# Patient Record
Sex: Female | Born: 1950
Health system: Southern US, Community
[De-identification: ages and names within clinical notes are randomized; demographics above are authoritative.]

## PROBLEM LIST (undated history)

## (undated) DIAGNOSIS — R011 Cardiac murmur, unspecified: Secondary | ICD-10-CM

## (undated) DIAGNOSIS — I10 Essential (primary) hypertension: Secondary | ICD-10-CM

## (undated) DIAGNOSIS — E039 Hypothyroidism, unspecified: Secondary | ICD-10-CM

## (undated) DIAGNOSIS — J019 Acute sinusitis, unspecified: Secondary | ICD-10-CM

## (undated) DIAGNOSIS — H918X9 Other specified hearing loss, unspecified ear: Secondary | ICD-10-CM

## (undated) DIAGNOSIS — M899 Disorder of bone, unspecified: Secondary | ICD-10-CM

## (undated) DIAGNOSIS — M949 Disorder of cartilage, unspecified: Secondary | ICD-10-CM

## (undated) DIAGNOSIS — D509 Iron deficiency anemia, unspecified: Secondary | ICD-10-CM

## (undated) DIAGNOSIS — E785 Hyperlipidemia, unspecified: Secondary | ICD-10-CM

## (undated) DIAGNOSIS — IMO0002 Reserved for concepts with insufficient information to code with codable children: Secondary | ICD-10-CM

## (undated) HISTORY — PX: TUBAL LIGATION: SHX77

## (undated) HISTORY — PX: BACK SURGERY: SHX140

## (undated) HISTORY — DX: Iron deficiency anemia, unspecified: D50.9

## (undated) HISTORY — DX: Hyperlipidemia, unspecified: E78.5

## (undated) HISTORY — DX: Reserved for concepts with insufficient information to code with codable children: IMO0002

## (undated) HISTORY — DX: Disorder of bone, unspecified: M89.9

## (undated) HISTORY — DX: Essential (primary) hypertension: I10

## (undated) HISTORY — DX: Other specified hearing loss, unspecified ear: H91.8X9

## (undated) HISTORY — DX: Cardiac murmur, unspecified: R01.1

## (undated) HISTORY — DX: Hypothyroidism, unspecified: E03.9

## (undated) HISTORY — PX: OTHER SURGICAL HISTORY: SHX169

## (undated) HISTORY — DX: Disorder of cartilage, unspecified: M94.9

## (undated) HISTORY — DX: Acute sinusitis, unspecified: J01.90

---

## 1972-10-24 DIAGNOSIS — I809 Phlebitis and thrombophlebitis of unspecified site: Secondary | ICD-10-CM

## 1972-10-24 HISTORY — DX: Phlebitis and thrombophlebitis of unspecified site: I80.9

## 2000-10-24 HISTORY — PX: OTHER SURGICAL HISTORY: SHX169

## 2003-04-01 ENCOUNTER — Encounter: Payer: Self-pay | Admitting: Family Medicine

## 2003-04-01 ENCOUNTER — Encounter: Admission: RE | Admit: 2003-04-01 | Discharge: 2003-04-01 | Payer: Self-pay | Admitting: Family Medicine

## 2003-04-24 ENCOUNTER — Other Ambulatory Visit: Admission: RE | Admit: 2003-04-24 | Discharge: 2003-04-24 | Payer: Self-pay | Admitting: Gynecology

## 2004-07-02 ENCOUNTER — Other Ambulatory Visit: Admission: RE | Admit: 2004-07-02 | Discharge: 2004-07-02 | Payer: Self-pay | Admitting: Gynecology

## 2005-03-10 ENCOUNTER — Ambulatory Visit: Payer: Self-pay | Admitting: Internal Medicine

## 2005-06-17 ENCOUNTER — Ambulatory Visit: Payer: Self-pay | Admitting: Endocrinology

## 2005-08-09 ENCOUNTER — Other Ambulatory Visit: Admission: RE | Admit: 2005-08-09 | Discharge: 2005-08-09 | Payer: Self-pay | Admitting: Gynecology

## 2005-08-16 ENCOUNTER — Ambulatory Visit: Payer: Self-pay | Admitting: Internal Medicine

## 2005-09-19 ENCOUNTER — Ambulatory Visit: Payer: Self-pay | Admitting: Internal Medicine

## 2005-09-26 ENCOUNTER — Ambulatory Visit: Payer: Self-pay | Admitting: Internal Medicine

## 2005-09-28 ENCOUNTER — Encounter: Admission: RE | Admit: 2005-09-28 | Discharge: 2005-09-28 | Payer: Self-pay | Admitting: Internal Medicine

## 2006-02-16 ENCOUNTER — Ambulatory Visit: Payer: Self-pay | Admitting: Internal Medicine

## 2006-08-14 ENCOUNTER — Other Ambulatory Visit: Admission: RE | Admit: 2006-08-14 | Discharge: 2006-08-14 | Payer: Self-pay | Admitting: Gynecology

## 2007-04-02 ENCOUNTER — Ambulatory Visit: Payer: Self-pay | Admitting: Internal Medicine

## 2007-08-17 ENCOUNTER — Other Ambulatory Visit: Admission: RE | Admit: 2007-08-17 | Discharge: 2007-08-17 | Payer: Self-pay | Admitting: Gynecology

## 2008-04-14 ENCOUNTER — Telehealth (INDEPENDENT_AMBULATORY_CARE_PROVIDER_SITE_OTHER): Payer: Self-pay | Admitting: *Deleted

## 2008-04-15 ENCOUNTER — Ambulatory Visit: Payer: Self-pay | Admitting: Internal Medicine

## 2008-04-15 DIAGNOSIS — M949 Disorder of cartilage, unspecified: Secondary | ICD-10-CM

## 2008-04-15 DIAGNOSIS — M899 Disorder of bone, unspecified: Secondary | ICD-10-CM | POA: Insufficient documentation

## 2008-04-15 DIAGNOSIS — E785 Hyperlipidemia, unspecified: Secondary | ICD-10-CM

## 2008-04-15 DIAGNOSIS — E039 Hypothyroidism, unspecified: Secondary | ICD-10-CM

## 2008-04-15 DIAGNOSIS — I10 Essential (primary) hypertension: Secondary | ICD-10-CM | POA: Insufficient documentation

## 2008-04-15 DIAGNOSIS — D509 Iron deficiency anemia, unspecified: Secondary | ICD-10-CM | POA: Insufficient documentation

## 2008-04-15 DIAGNOSIS — F411 Generalized anxiety disorder: Secondary | ICD-10-CM | POA: Insufficient documentation

## 2008-04-15 HISTORY — DX: Hypothyroidism, unspecified: E03.9

## 2008-04-15 HISTORY — DX: Disorder of bone, unspecified: M89.9

## 2008-04-15 HISTORY — DX: Hyperlipidemia, unspecified: E78.5

## 2008-04-15 HISTORY — DX: Essential (primary) hypertension: I10

## 2008-04-15 HISTORY — DX: Iron deficiency anemia, unspecified: D50.9

## 2008-08-07 ENCOUNTER — Telehealth (INDEPENDENT_AMBULATORY_CARE_PROVIDER_SITE_OTHER): Payer: Self-pay | Admitting: *Deleted

## 2008-11-21 ENCOUNTER — Telehealth: Payer: Self-pay | Admitting: Internal Medicine

## 2009-02-26 ENCOUNTER — Encounter: Payer: Self-pay | Admitting: Internal Medicine

## 2009-02-26 ENCOUNTER — Ambulatory Visit: Payer: Self-pay | Admitting: Gynecology

## 2009-03-02 ENCOUNTER — Ambulatory Visit: Payer: Self-pay | Admitting: Gynecology

## 2009-03-02 ENCOUNTER — Encounter: Payer: Self-pay | Admitting: Gynecology

## 2009-03-02 ENCOUNTER — Other Ambulatory Visit: Admission: RE | Admit: 2009-03-02 | Discharge: 2009-03-02 | Payer: Self-pay | Admitting: Gynecology

## 2009-03-11 ENCOUNTER — Ambulatory Visit: Payer: Self-pay | Admitting: Internal Medicine

## 2009-03-11 DIAGNOSIS — N259 Disorder resulting from impaired renal tubular function, unspecified: Secondary | ICD-10-CM | POA: Insufficient documentation

## 2009-03-11 LAB — CONVERTED CEMR LAB
Bilirubin Urine: NEGATIVE
Ketones, ur: NEGATIVE mg/dL
Total Protein, Urine: NEGATIVE mg/dL
Urine Glucose: NEGATIVE mg/dL
pH: 5.5 (ref 5.0–8.0)

## 2009-03-18 ENCOUNTER — Encounter: Admission: RE | Admit: 2009-03-18 | Discharge: 2009-03-18 | Payer: Self-pay | Admitting: Internal Medicine

## 2009-04-13 ENCOUNTER — Ambulatory Visit: Payer: Self-pay | Admitting: Internal Medicine

## 2009-04-13 LAB — CONVERTED CEMR LAB
Albumin: 4 g/dL (ref 3.5–5.2)
Alkaline Phosphatase: 57 units/L (ref 39–117)
BUN: 20 mg/dL (ref 6–23)
Basophils Absolute: 0 10*3/uL (ref 0.0–0.1)
Calcium: 9.5 mg/dL (ref 8.4–10.5)
Cholesterol: 299 mg/dL — ABNORMAL HIGH (ref 0–200)
Eosinophils Absolute: 0.1 10*3/uL (ref 0.0–0.7)
GFR calc non Af Amer: 54.53 mL/min (ref >60)
HDL: 44.2 mg/dL (ref >39.00)
Hemoglobin, Urine: NEGATIVE
Hemoglobin: 14.3 g/dL (ref 12.0–15.0)
Lymphocytes Relative: 27.6 % (ref 12.0–46.0)
MCHC: 34.9 g/dL (ref 30.0–36.0)
MCV: 94.5 fL (ref 78.0–100.0)
Monocytes Absolute: 0.7 10*3/uL (ref 0.1–1.0)
Neutro Abs: 4.3 10*3/uL (ref 1.4–7.7)
Nitrite: NEGATIVE
Potassium: 4.6 meq/L (ref 3.5–5.1)
RDW: 14 % (ref 11.5–14.6)
Sodium: 142 meq/L (ref 135–145)
Specific Gravity, Urine: 1.025 (ref 1.000–1.030)
TSH: 6.3 microintl units/mL — ABNORMAL HIGH (ref 0.35–5.50)
Total Protein: 7.4 g/dL (ref 6.0–8.3)
Urine Glucose: NEGATIVE mg/dL
Urobilinogen, UA: 0.2 (ref 0.0–1.0)
VLDL: 18 mg/dL (ref 0.0–40.0)

## 2009-04-15 ENCOUNTER — Ambulatory Visit: Payer: Self-pay | Admitting: Internal Medicine

## 2009-11-24 ENCOUNTER — Ambulatory Visit: Payer: Self-pay | Admitting: Internal Medicine

## 2009-11-24 DIAGNOSIS — IMO0002 Reserved for concepts with insufficient information to code with codable children: Secondary | ICD-10-CM

## 2009-11-24 HISTORY — DX: Reserved for concepts with insufficient information to code with codable children: IMO0002

## 2009-11-25 ENCOUNTER — Ambulatory Visit (HOSPITAL_COMMUNITY): Admission: RE | Admit: 2009-11-25 | Discharge: 2009-11-25 | Payer: Self-pay | Admitting: Internal Medicine

## 2009-12-01 ENCOUNTER — Encounter: Payer: Self-pay | Admitting: Internal Medicine

## 2009-12-18 ENCOUNTER — Encounter: Payer: Self-pay | Admitting: Internal Medicine

## 2009-12-23 ENCOUNTER — Ambulatory Visit (HOSPITAL_COMMUNITY): Admission: RE | Admit: 2009-12-23 | Discharge: 2009-12-24 | Payer: Self-pay | Admitting: Neurosurgery

## 2010-01-15 ENCOUNTER — Encounter: Payer: Self-pay | Admitting: Internal Medicine

## 2010-03-19 ENCOUNTER — Encounter: Payer: Self-pay | Admitting: Internal Medicine

## 2010-04-21 ENCOUNTER — Ambulatory Visit: Payer: Self-pay | Admitting: Internal Medicine

## 2010-04-21 ENCOUNTER — Telehealth: Payer: Self-pay | Admitting: Internal Medicine

## 2010-04-22 LAB — CONVERTED CEMR LAB
AST: 16 units/L (ref 0–37)
Albumin: 4.6 g/dL (ref 3.5–5.2)
Alkaline Phosphatase: 59 units/L (ref 39–117)
BUN: 17 mg/dL (ref 6–23)
Basophils Absolute: 0.1 10*3/uL (ref 0.0–0.1)
Bilirubin Urine: NEGATIVE
CO2: 29 meq/L (ref 19–32)
Calcium: 9.8 mg/dL (ref 8.4–10.5)
Cholesterol: 366 mg/dL — ABNORMAL HIGH (ref 0–200)
Creatinine, Ser: 0.9 mg/dL (ref 0.4–1.2)
Direct LDL: 287.7 mg/dL
Eosinophils Absolute: 0.1 10*3/uL (ref 0.0–0.7)
GFR calc non Af Amer: 71.22 mL/min (ref >60)
Glucose, Bld: 85 mg/dL (ref 70–99)
HDL: 53.2 mg/dL (ref >39.00)
Hemoglobin, Urine: NEGATIVE
Hemoglobin: 14.5 g/dL (ref 12.0–15.0)
Lymphocytes Relative: 29.3 % (ref 12.0–46.0)
Lymphs Abs: 2.2 10*3/uL (ref 0.7–4.0)
MCHC: 34.5 g/dL (ref 30.0–36.0)
Monocytes Relative: 9.8 % (ref 3.0–12.0)
Neutro Abs: 4.5 10*3/uL (ref 1.4–7.7)
Platelets: 198 10*3/uL (ref 150.0–400.0)
RDW: 13 % (ref 11.5–14.6)
Total Bilirubin: 0.4 mg/dL (ref 0.3–1.2)
Total CHOL/HDL Ratio: 7
Total Protein, Urine: NEGATIVE mg/dL
Urine Glucose: NEGATIVE mg/dL
Urobilinogen, UA: 0.2 (ref 0.0–1.0)
VLDL: 29 mg/dL (ref 0.0–40.0)
Vit D, 25-Hydroxy: 16 ng/mL — ABNORMAL LOW (ref 30–89)

## 2010-06-22 ENCOUNTER — Encounter: Payer: Self-pay | Admitting: Internal Medicine

## 2010-08-24 ENCOUNTER — Encounter (INDEPENDENT_AMBULATORY_CARE_PROVIDER_SITE_OTHER): Payer: Self-pay | Admitting: *Deleted

## 2010-09-30 ENCOUNTER — Ambulatory Visit: Payer: Self-pay | Admitting: Internal Medicine

## 2010-09-30 DIAGNOSIS — J019 Acute sinusitis, unspecified: Secondary | ICD-10-CM

## 2010-09-30 DIAGNOSIS — H918X9 Other specified hearing loss, unspecified ear: Secondary | ICD-10-CM | POA: Insufficient documentation

## 2010-09-30 HISTORY — DX: Other specified hearing loss, unspecified ear: H91.8X9

## 2010-09-30 HISTORY — DX: Acute sinusitis, unspecified: J01.90

## 2010-10-28 ENCOUNTER — Ambulatory Visit: Admit: 2010-10-28 | Payer: Self-pay | Admitting: Internal Medicine

## 2010-11-23 NOTE — Progress Notes (Signed)
       New/Updated Medications: WELCHOL 3.75 GM PACK (COLESEVELAM HCL) 1 pkt in 8 oz water by mouth once daily Prescriptions: WELCHOL 3.75 GM PACK (COLESEVELAM HCL) 1 pkt in 8 oz water by mouth once daily  #90 x 3   Entered and Authorized by:   Corwin Levins MD   Signed by:   Corwin Levins MD on 04/21/2010   Method used:   Electronically to        [id number] (retail)       [address]       [place], [place]  [address]       Ph: [phone] or [phone]       Fax: [phone]   RxID:   [id number]

## 2010-11-23 NOTE — Assessment & Plan Note (Signed)
Summary: REFILL--PER PT APPT REQUIRED--STC   Vital Signs:  Patient profile:   60 year old female Height:      66 inches Weight:      119.50 pounds BMI:     19.36 O2 Sat:      98 % on Room air Temp:     98.1 degrees F oral Pulse rate:   70 / minute BP sitting:   120 / 80  (left arm) Cuff size:   regular  Vitals Entered By: Zella Ball Ewing CMA Duncan Dull) (April 21, 2010 10:21 AM)  O2 Flow:  Room air  CC: Refills/RE   CC:  Refills/RE.  History of Present Illness: overall doing ok, Pt denies CP, sob, doe, wheezing, orthopnea, pnd, worsening LE edema, palps, dizziness or syncope  Pt denies new neuro symptoms such as headache, facial or extremity weakness  No new complaints  Problems Prior to Update: 1)  Lumbar Radiculopathy, Left  (ICD-724.4) 2)  Preventive Health Care  (ICD-V70.0) 3)  Renal Insufficiency  (ICD-588.9) 4)  Anemia-iron Deficiency  (ICD-280.9) 5)  Hyperlipidemia  (ICD-272.4) 6)  Osteopenia  (ICD-733.90) 7)  Hypothyroidism  (ICD-244.9) 8)  Hypertension  (ICD-401.9) 9)  Anxiety  (ICD-300.00) 10)  Preventive Health Care  (ICD-V70.0)  Medications Prior to Update: 1)  Levothroid 50 Mcg  Tabs (Levothyroxine Sodium) .... Take 1 Tablet By Mouth Once A Day 2)  Amlodipine Besylate 5 Mg Tabs (Amlodipine Besylate) .Marland Kitchen.. 1 By Mouth Once Daily 3)  Oxycodone Hcl 5 Mg Tabs (Oxycodone Hcl) .Marland Kitchen.. 1 - 3 By Mouth Four Times Per Day As Needed Pain 4)  Flexeril 5 Mg Tabs (Cyclobenzaprine Hcl) .Marland Kitchen.. 1 By Mouth Three Times A Day As Needed 5)  Prednisone 10 Mg Tabs (Prednisone) .... 4po Qd For 3days, Then 3po Qd For 3days, Then 2po Qd For 3days, Then 1po Qd For 3 Days, Then Stop  Current Medications (verified): 1)  Levothroid 50 Mcg  Tabs (Levothyroxine Sodium) .... Take 1 Tablet By Mouth Once A Day 2)  Amlodipine Besylate 5 Mg Tabs (Amlodipine Besylate) .Marland Kitchen.. 1 By Mouth Once Daily 3)  Aspir-Low 81 Mg Tbec (Aspirin) .Marland Kitchen.. 1po Once Daily  Allergies (verified): 1)  ! Lipitor 2)  ! 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 Anxiety Hypertension Hypothyroidism Osteopenia Hyperlipidemia Anemia-iron deficiency migraine lumbar disc disease  Social History: Reviewed history from 04/15/2008 and no changes required. 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         all otherwise negative per pt -    Physical Exam  General:  alert and underweight appearing.   Head:  normocephalic and atraumatic.   Eyes:  vision grossly intact, pupils equal, and pupils round.   Ears:  R ear normal and L ear normal.   Nose:  no external deformity and no nasal discharge.   Mouth:  no gingival abnormalities and pharynx pink and moist.   Neck:  supple and no masses.   Lungs:  normal respiratory effort and normal breath sounds.   Heart:  normal rate and  regular rhythm.   Abdomen:  soft, non-tender, and normal bowel sounds.   Msk:  no joint tenderness and no joint swelling.   Extremities:  no edema, no erythema  Neurologic:  cranial nerves II-XII intact and strength normal in all extremities.     Impression & Recommendations:  Problem # 1:  Preventive Health Care (ICD-V70.0)  Overall doing well, age appropriate education and counseling updated and referral for appropriate preventive services done unless declined, immunizations up to date or declined, diet counseling done if overweight, urged to quit smoking if smokes , most recent labs reviewed and current ordered if appropriate, ecg reviewed or declined (interpretation per ECG scanned in the EMR if done); information regarding Medicare Prevention requirements given if appropriate; speciality referrals updated as appropriate   Orders: T-Vitamin D (25-Hydroxy) (81191-47829) Gastroenterology Referral (GI) TLB-BMP (Basic Metabolic Panel-BMET) (80048-METABOL) TLB-CBC Platelet - w/Differential (85025-CBCD) TLB-Hepatic/Liver Function Pnl (80076-HEPATIC) TLB-Lipid Panel (80061-LIPID) TLB-TSH (Thyroid Stimulating Hormone) (84443-TSH) TLB-Udip ONLY (81003-UDIP)  Problem # 2:  HYPERTENSION (ICD-401.9)  BP today: 120/80 Prior BP: 154/110 (11/24/2009)  Labs Reviewed: K+: 4.6 (04/13/2009) Creat: : 1.1 (04/13/2009)   Chol: 299 (04/13/2009)   HDL: 44.20 (04/13/2009)   TG: 90.0 (04/13/2009)  Her updated medication list for this problem includes:    Amlodipine Besylate 5 Mg Tabs (Amlodipine besylate) .Marland Kitchen... 1 by mouth once daily stable overall by hx and exam, ok to continue meds/tx as is   Complete Medication List: 1)  Levothroid 50 Mcg Tabs (Levothyroxine sodium) .... Take 1 tablet by mouth once a day 2)  Amlodipine Besylate 5 Mg Tabs (Amlodipine besylate) .Marland Kitchen.. 1 by mouth once daily 3)  Aspir-low 81 Mg Tbec (Aspirin) .Marland Kitchen.. 1po once daily 4)  Welchol 3.75 Gm Pack (Colesevelam hcl) .Marland Kitchen.. 1 pkt in  8 oz water by mouth once daily  Patient Instructions: 1)  Take an Aspirin every day - 81 mg - 1 per day - CAOTED only 2)  you are given the refills today 3)  You will be contacted about the referral(s) to: colonoscopy 4)  Please go to the Lab in the basement for your blood and/or urine tests today  5)  Please schedule a follow-up appointment in 1 year or sooner if needed Prescriptions: AMLODIPINE BESYLATE 5 MG TABS (AMLODIPINE BESYLATE) 1 by mouth once daily  #90 x 3   Entered and Authorized by:   Corwin Levins MD   Signed by:   Corwin Levins MD on 04/21/2010   Method used:   Electronically to        [id number]       [address]       [place], [place]  [address]       Ph: [phone] or [phone]       Fax: [phone]   RxID:   [id number] LEVOTHROID 50 MCG  TABS (LEVOTHYROXINE SODIUM) Take 1 tablet by mouth once a day  #90 x 3   Entered and Authorized by:   Corwin Levins MD   Signed by:  Corwin Levins MD on 04/21/2010   Method used:   Electronically to        [facility] ([facility]       [address]       [place], [place]  [address]       Ph: [phone] or [phone]       Fax: [phone]   RxID:   [id number]

## 2010-11-23 NOTE — Letter (Signed)
Summary: [facility]  [facility]   Imported By: Lennie Odor 02/04/2010 11:37:55  _____________________________________________________________________  External Attachment:    Type:   Image     Comment:   External Document

## 2010-11-23 NOTE — Letter (Signed)
Summary: [facility]  [facility]   Imported By: Sherian Rein 01/01/2010 07:45:40  _____________________________________________________________________  External Attachment:    Type:   Image     Comment:   External Document

## 2010-11-23 NOTE — Assessment & Plan Note (Signed)
Summary: BACK PAIN/NWS   Vital Signs:  Patient profile:   60 year old female Height:      67 inches Weight:      112 pounds BMI:     17.61 O2 Sat:      98 % on Room air Temp:     97.6 degrees F oral Pulse rate:   81 / minute BP sitting:   154 / 110  (left arm) Cuff size:   regular  Vitals Entered ByZella Ball Ewing (November 24, 2009 10:20 AM)  O2 Flow:  Room air CC: back pain/RE   CC:  back pain/RE.  History of Present Illness: has appt feb 8 with dr Newell Coral, [place];  here with severe LBP with radiation to LLE and buttocks ,  worse to sit, better to standing up and walking; no numbness or weakness, graudal;ly worse over 4 wks;  may have gotten worse to start after an episode of standing up from stopped position in the BR, and later with pushing a xmas tree box up to the attic to the husband.  Pain overall similar to prior disc problem.  No fever, wt loss, night sweats,  no change in bowel of bladder.  no falls or other trauma.  Lasst MRI 2007.  no prior surgury.  Tx with muscle relaxer and pain pill.  No cortisone, no PT prior, and no prior surguries.    Problems Prior to Update: 1)  Lumbar Radiculopathy, Left  (ICD-724.4) 2)  Preventive Health Care  (ICD-V70.0) 3)  Renal Insufficiency  (ICD-588.9) 4)  Anemia-iron Deficiency  (ICD-280.9) 5)  Hyperlipidemia  (ICD-272.4) 6)  Osteopenia  (ICD-733.90) 7)  Hypothyroidism  (ICD-244.9) 8)  Hypertension  (ICD-401.9) 9)  Anxiety  (ICD-300.00) 10)  Preventive Health Care  (ICD-V70.0)  Medications Prior to Update: 1)  Levothroid 50 Mcg  Tabs (Levothyroxine Sodium) .... Take 1 Tablet By Mouth Once A Day 2)  Amlodipine Besylate 5 Mg Tabs (Amlodipine Besylate) .Marland Kitchen.. 1 By Mouth Once Daily 3)  Crestor 20 Mg Tabs (Rosuvastatin Calcium) .Marland Kitchen.. 1 By Mouth Once Daily  Current Medications (verified): 1)  Levothroid 50 Mcg  Tabs (Levothyroxine Sodium) .... Take 1 Tablet By Mouth Once A Day 2)  Amlodipine Besylate 5 Mg Tabs (Amlodipine Besylate) .Marland Kitchen.. 1  By Mouth Once Daily 3)  Oxycodone Hcl 5 Mg Tabs (Oxycodone Hcl) .Marland Kitchen.. 1 - 3 By Mouth Four Times Per Day As Needed Pain 4)  Flexeril 5 Mg Tabs (Cyclobenzaprine Hcl) .Marland Kitchen.. 1 By Mouth Three Times A Day As Needed 5)  Prednisone 10 Mg Tabs (Prednisone) .... 4po Qd For 3days, Then 3po Qd For 3days, Then 2po Qd For 3days, Then 1po Qd For 3 Days, Then Stop  Allergies (verified): 1)  ! Lipitor 2)  ! 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   Head:  normocephalic and atraumatic.   Eyes:  vision grossly intact, pupils equal, and pupils round.   Ears:  R ear normal and L ear normal.   Nose:  no external  deformity and no nasal discharge.   Mouth:  no gingival abnormalities and pharynx pink and moist.   Neck:  supple and no masses.   Lungs:  normal respiratory effort and normal breath sounds.   Heart:  normal rate and regular rhythm.   Abdomen:  soft, non-tender, and normal bowel sounds.   Msk:  mod to severe tender without swelling  to the lower lumbar and left sciatic notch area Extremities:  no edema, no erythema  Neurologic:  strength normal in all lower extremities and sensation intact to light touch.     Impression & Recommendations:  Problem # 1:  LUMBAR RADICULOPATHY, LEFT (ICD-724.4)  Her updated medication list for this problem includes:    Oxycodone Hcl 5 Mg Tabs (Oxycodone hcl) .Marland Kitchen... 1 - 3 by mouth four times per day as needed  pain    Flexeril 5 Mg Tabs (Cyclobenzaprine hcl) .Marland Kitchen... 1 by mouth three times a day as needed  Orders: Ketorolac-Toradol 15mg  (Z6109) Admin of Therapeutic Inj  intramuscular or subcutaneous (60454) Radiology Referral (Radiology) exam most likely c/w flare sciatica/radiculitis with exam o/w benign;  does have typical pain and most likely to mechanical issue ;  treat as above, f/u any worsening signs or symptoms ,  for LS Spine MRI, and to f/u with NS as planned  Problem # 2:  HYPERTENSION (ICD-401.9)  Her updated medication list for this problem includes:    Amlodipine Besylate 5 Mg Tabs (Amlodipine besylate) .Marland Kitchen... 1 by mouth once daily encouraged pt to take, tried to reassure ok to take after she has read the side effect profile that came with the prescription  Complete Medication List: 1)  Levothroid 50 Mcg Tabs (Levothyroxine sodium) .... Take 1 tablet by mouth once a day 2)  Amlodipine Besylate 5 Mg Tabs (Amlodipine besylate) .Marland Kitchen.. 1 by mouth once daily 3)  Oxycodone Hcl 5 Mg Tabs (Oxycodone hcl) .Marland Kitchen.. 1 - 3 by mouth four times per day as needed pain 4)  Flexeril 5 Mg Tabs (Cyclobenzaprine hcl) .Marland Kitchen.. 1 by mouth three times a day as needed 5)  Prednisone 10 Mg Tabs (Prednisone) .... 4po qd for 3days, then 3po qd for 3days, then 2po qd for 3days, then 1po qd for 3 days, then stop  Patient Instructions: 1)  you had the pain shot today (toradol) 2)  Please take all new medications as prescribed  3)  Continue all previous medications as before this visit  4)  You will be contacted about the referral(s) to: MRI for the lower back 5)  please keep your appt with dr Nudelman feb 8 as planned 6)  Please schedule a follow-up appointment in June 2011 with CPX labs 7)  Check your Blood Pressure regularly. If it is above 140/90: you should make an appointment sooner Prescriptions: PREDNISONE 10 MG TABS (PREDNISONE) 4po qd for 3days, then 3po qd for 3days, then 2po qd for 3days, then 1po qd for 3  days, then stop  #30 x 0   Entered and Authorized by:   Corwin Levins MD   Signed by:   Corwin Levins MD on 11/24/2009   Method used:   Print then Give to Patient   RxID:   [id number] FLEXERIL 5 MG TABS (CYCLOBENZAPRINE HCL) 1 by mouth three times a day as needed  #90 x 1   Entered and Authorized by:   Corwin Levins MD   Signed by:   Corwin Levins MD on 11/24/2009   Method used:  Print then Give to Patient   RxID:   [id number] OXYCODONE HCL 5 MG TABS (OXYCODONE HCL) 1 - 3 by mouth four times per day as needed pain  #100 x 0   Entered and Authorized by:   Corwin Levins MD   Signed by:   Corwin Levins MD on 11/24/2009   Method used:   Print then Give to Patient   RxID:   [id number] OXYCODONE HCL 5 MG TABS (OXYCODONE HCL) 1 - 3 by mouth three times a day as needed  #100 x 0   Entered and Authorized by:   Corwin Levins MD   Signed by:   Corwin Levins MD on 11/24/2009   Method used:   Print then Give to Patient   RxID:   [id number] AMLODIPINE BESYLATE 5 MG TABS (AMLODIPINE BESYLATE) 1 by mouth once daily  #30 x 11   Entered and Authorized by:   Corwin Levins MD   Signed by:   Corwin Levins MD on 11/24/2009   Method used:   Print then Give to Patient   RxID:   [id number]    Medication Administration  Injection # 1:    Medication: Ketorolac-Toradol 15mg     Diagnosis: LUMBAR RADICULOPATHY, LEFT (ICD-724.4)    Route: IM    Site: RUOQ gluteus    Exp Date: 05/25/2011    Lot #: [id number]    Mfr: Nova Plus    Given by: Zella Ball Ewing (November 24, 2009 11:11 AM)  Orders Added: 1)  Ketorolac-Toradol 15mg  [J1885] 2)  Admin of Therapeutic Inj  intramuscular or subcutaneous [87564] 3)  Radiology Referral [Radiology] 4)  Est. Patient Level IV [33295]

## 2010-11-23 NOTE — Letter (Signed)
Summary: [facility]  [facility]   Imported By: Sherian Rein 03/31/2010 11:47:53  _____________________________________________________________________  External Attachment:    Type:   Image     Comment:   External Document

## 2010-11-23 NOTE — Letter (Signed)
Summary: [facility] Referral (unable to schedule) Notification  [facility]  [address] [place], [place] [address]   Phone: [phone]  Fax: [phone]      August 24, 2010 Jennifer Andrade December 11, 1950 MRN: [id number]   [facility] [address] [place], [place]  [address]   Dear Dr. Jonny Ruiz:   Thank you for your kind referral of the above patient. We have attempted to schedule the recommended Colonoscopy but have been unable to schedule because:  _x_ The patient was not available by phone and/or has not returned our calls.  __ The patient declined to schedule the procedure at this time.  We appreciate the referral and hope that we will have the opportunity to treat this patient in the future.    Sincerely,   [facility]  Vania Rea. Jarold Motto M.D. Hedwig Morton. Juanda Chance M.D. Venita Lick. Russella Dar M.D. Wilhemina Bonito. Marina Goodell M.D. Barbette Hair. Arlyce Dice M.D. Iva Boop M.D. Cheron Every.D.

## 2010-11-23 NOTE — Consult Note (Signed)
Summary: [facility]  [facility]   Imported By: Lester [place] 12/18/2009 07:21:36  _____________________________________________________________________  External Attachment:    Type:   Image     Comment:   External Document

## 2010-11-23 NOTE — Assessment & Plan Note (Signed)
Summary: cold-lb   Vital Signs:  Patient profile:   60 year old female Height:      66 inches Weight:      125.50 pounds BMI:     20.33 O2 Sat:      95 % on Room air Temp:     98.1 degrees F oral Pulse rate:   66 / minute BP sitting:   122 / 70  (left arm) Cuff size:   regular  Vitals Entered By: Zella Ball Ewing CMA (AAMA) (September 30, 2010 3:24 PM)  O2 Flow:  Room air CC: Congestion, cough, chest discomfort/RE   CC:  Congestion, cough, and chest discomfort/RE.  History of Present Illness: due for right cataract next wk, and then left cataract for jan 2012; husband ill for last 10 days, and now pt with c/o 3 days onset fever, facial pain, pressure and greenish d/c , mild sT, and nonprod cough but Pt denies CP, sob , doe, wheezing, orthopnea, pnd, worsening LE edema, palps, dizziness or syncope. Trying to follow lower chol diet, but has been statin intolberant, also today she just could not tolerate the powder welchol as well due to taste and consistency.   Wants to get better quickly as she is scheduled for right cataract surg next wk, and then left cataract surgury jan 2012.  Pt denies new neuro symptoms such as headache, facial or extremity weakness  Pt denies polydipsia, polyuria  Overall good compliance with meds, trying to follow low chol diet, wt stable, little excercise however  Incidently with some hearing loss after the shower in the past wk off and on as well - tends to accumlate wax.  Problems Prior to Update: 1)  Lumbar Radiculopathy, Left  (ICD-724.4) 2)  Preventive Health Care  (ICD-V70.0) 3)  Renal Insufficiency  (ICD-588.9) 4)  Anemia-iron Deficiency  (ICD-280.9) 5)  Hyperlipidemia  (ICD-272.4) 6)  Osteopenia  (ICD-733.90) 7)  Hypothyroidism  (ICD-244.9) 8)  Hypertension  (ICD-401.9) 9)  Anxiety  (ICD-300.00) 10)  Preventive Health Care  (ICD-V70.0)  Medications Prior to Update: 1)  Levothroid 50 Mcg  Tabs (Levothyroxine Sodium) .... Take 1 Tablet By Mouth Once A  Day 2)  Amlodipine Besylate 5 Mg Tabs (Amlodipine Besylate) .Marland Kitchen.. 1 By Mouth Once Daily 3)  Aspir-Low 81 Mg Tbec (Aspirin) .Marland Kitchen.. 1po Once Daily 4)  Welchol 3.75 Gm Pack (Colesevelam Hcl) .Marland Kitchen.. 1 Pkt in 8 Oz Water By Mouth Once Daily  Current Medications (verified): 1)  Levothroid 50 Mcg  Tabs (Levothyroxine Sodium) .... Take 1 Tablet By Mouth Once A Day 2)  Amlodipine Besylate 5 Mg Tabs (Amlodipine Besylate) .Marland Kitchen.. 1 By Mouth Once Daily 3)  Aspir-Low 81 Mg Tbec (Aspirin) .Marland Kitchen.. 1po Once Daily 4)  Zetia 10 Mg Tabs (Ezetimibe) .Marland Kitchen.. 1po Once Daily 5)  Levofloxacin 500 Mg Tabs (Levofloxacin) .Marland Kitchen.. 1po Once Daily  Allergies (verified): 1)  ! 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  , mild ill  Head:  normocephalic and atraumatic.   Eyes:  vision grossly intact, pupils equal, and pupils round.   Ears:  left canal with wax impaction - some improved but pt did not tolerate  irrigation well;  right tm mild erythema but no bulging, canals o/w ok, sinus tender bilat right > left  Nose:  nasal dischargemucosal pallor and mucosal edema.   Mouth:  pharyngeal erythema and fair dentition.   Neck:  supple and cervical  lymphadenopathy.   Lungs:  normal respiratory effort and normal breath sounds.   Heart:  normal rate and regular rhythm.   Extremities:  no edema, no erythema  Neurologic:  cranial nerves II-XII intact and strength normal in all extremities.  after irrigation with improved left hearing   Impression & Recommendations:  Problem # 1:  SINUSITIS- ACUTE-NOS (ICD-461.9) Assessment New  Her updated medication list for this problem includes:    Levofloxacin 500 Mg Tabs (Levofloxacin) .Marland Kitchen... 1po once daily treat as above, f/u any worsening signs or symptoms   Problem # 2:  HYPERTENSION (ICD-401.9) Assessment: Unchanged  Her updated medication list for this problem includes:    Amlodipine Besylate 5 Mg Tabs (Amlodipine besylate) .Marland Kitchen... 1 by mouth once daily  BP today: 122/70 Prior BP: 120/80 (04/21/2010)  Labs Reviewed: K+: 4.3 (04/21/2010) Creat: : 0.9 (04/21/2010)   Chol: 366 (04/21/2010)   HDL: 53.20 (04/21/2010)   TG: 145.0 (04/21/2010) stable overall by hx and exam, ok to continue meds/tx as is   Problem # 3:  HYPERLIPIDEMIA (ICD-272.4)  Her updated medication list for this problem includes:    Zetia 10 Mg Tabs (Ezetimibe) .Marland Kitchen... 1po once daily  Labs Reviewed: SGOT: 16 (04/21/2010)   SGPT: 11 (04/21/2010)   HDL:53.20 (04/21/2010), 44.20 (04/13/2009)  Chol:366 (04/21/2010), 299 (04/13/2009)  Trig:145.0 (04/21/2010), 90.0 (04/13/2009) mutl drug intolerant, treat as above, f/u any worsening signs or symptoms , f/u labs 4 wks, consider lipid clinic  Problem # 4:  OTHER SPECIFIED FORMS OF HEARING LOSS (ICD-389.8) Assessment: New left due to wax impaction - now improved  Complete Medication List: 1)  Levothroid 50 Mcg Tabs (Levothyroxine sodium) .... Take 1 tablet by mouth once a day 2)  Amlodipine Besylate 5 Mg Tabs (Amlodipine besylate) .Marland Kitchen.. 1 by mouth once daily 3)  Aspir-low 81 Mg Tbec (Aspirin) .Marland Kitchen.. 1po once daily 4)  Zetia 10 Mg Tabs (Ezetimibe) .Marland Kitchen.. 1po once daily 5)   Levofloxacin 500 Mg Tabs (Levofloxacin) .Marland Kitchen.. 1po once daily  Patient Instructions: 1)  Please take all new medications as prescribed - the antibiotic, and the zetia for cholesterol 2)  Continue all previous medications as before this visit  3)  Your left ear was irrigated today 4)  You can also use Mucinex OTC or it's generic for congestion  5)  You should be ok for the cataract surgury next week 6)  please return for LAB only in 4 wks:  Lipids 272.0 7)  Please schedule a follow-up appointment June 2012 for CPX with labs Prescriptions: LEVOFLOXACIN 500 MG TABS (LEVOFLOXACIN) 1po once daily  #10 x 0   Entered and Authorized by:   Corwin Levins MD   Signed by:   Corwin Levins MD on 09/30/2010   Method used:   Print then Give to Patient   RxID:   [id number] LEVOTHROID 50 MCG  TABS (LEVOTHYROXINE SODIUM) Take 1 tablet  by mouth once a day  #90 x 3   Entered and Authorized by:   Corwin Levins MD   Signed by:   Corwin Levins MD on 09/30/2010   Method used:   Print then Give to Patient   RxID:   [id number] AMLODIPINE BESYLATE 5 MG TABS (AMLODIPINE BESYLATE) 1 by mouth once daily  #90 x 3   Entered and Authorized by:   Corwin Levins MD   Signed by:   Corwin Levins MD on 09/30/2010   Method used:   Print then Give to Patient   RxID:   [id number] ZETIA 10 MG TABS (EZETIMIBE) 1po once daily  #90 x 3   Entered and Authorized by:   Corwin Levins MD   Signed by:   Corwin Levins MD on 09/30/2010   Method used:   Print then Give to Patient   RxID:   [id number]    Orders Added: 1)  Est. Patient Level IV [01027]

## 2010-11-23 NOTE — Letter (Signed)
Summary: [facility]  [facility]   Imported By: Lester  07/08/2010 10:13:17  _____________________________________________________________________  External Attachment:    Type:   Image     Comment:   External Document

## 2011-01-17 LAB — CBC
HCT: 45.4 % (ref 36.0–46.0)
Hemoglobin: 15.5 g/dL — ABNORMAL HIGH (ref 12.0–15.0)
MCHC: 34.2 g/dL (ref 30.0–36.0)
MCV: 92.9 fL (ref 78.0–100.0)
Platelets: 190 10*3/uL (ref 150–400)
RBC: 4.88 MIL/uL (ref 3.87–5.11)
RDW: 13.4 % (ref 11.5–15.5)
WBC: 7 10*3/uL (ref 4.0–10.5)

## 2011-01-17 LAB — BASIC METABOLIC PANEL
BUN: 26 mg/dL — ABNORMAL HIGH (ref 6–23)
CO2: 25 mEq/L (ref 19–32)
Calcium: 9.4 mg/dL (ref 8.4–10.5)
Chloride: 101 mEq/L (ref 96–112)
Creatinine, Ser: 0.96 mg/dL (ref 0.4–1.2)
GFR calc Af Amer: >60 mL/min (ref >60)
GFR calc non Af Amer: 60 mL/min — ABNORMAL LOW (ref >60)
Glucose, Bld: 90 mg/dL (ref 70–99)
Potassium: 4.7 mEq/L (ref 3.5–5.1)
Sodium: 135 mEq/L (ref 135–145)

## 2011-01-17 LAB — SURGICAL PCR SCREEN
MRSA, PCR: NEGATIVE
Staphylococcus aureus: NEGATIVE

## 2011-02-05 ENCOUNTER — Encounter: Payer: Self-pay | Admitting: Internal Medicine

## 2011-02-05 DIAGNOSIS — Z0001 Encounter for general adult medical examination with abnormal findings: Secondary | ICD-10-CM | POA: Insufficient documentation

## 2011-02-05 DIAGNOSIS — Z0000 Encounter for general adult medical examination without abnormal findings: Secondary | ICD-10-CM | POA: Insufficient documentation

## 2011-02-08 ENCOUNTER — Encounter: Payer: Self-pay | Admitting: Internal Medicine

## 2011-02-08 ENCOUNTER — Ambulatory Visit (INDEPENDENT_AMBULATORY_CARE_PROVIDER_SITE_OTHER): Payer: Managed Care, Other (non HMO) | Admitting: Internal Medicine

## 2011-02-08 VITALS — BP 152/78 | HR 72 | Temp 98.4°F | Ht 65.0 in | Wt 121.4 lb

## 2011-02-08 DIAGNOSIS — N259 Disorder resulting from impaired renal tubular function, unspecified: Secondary | ICD-10-CM

## 2011-02-08 DIAGNOSIS — Z0000 Encounter for general adult medical examination without abnormal findings: Secondary | ICD-10-CM

## 2011-02-08 DIAGNOSIS — E039 Hypothyroidism, unspecified: Secondary | ICD-10-CM

## 2011-02-08 DIAGNOSIS — I10 Essential (primary) hypertension: Secondary | ICD-10-CM

## 2011-02-08 DIAGNOSIS — F411 Generalized anxiety disorder: Secondary | ICD-10-CM

## 2011-02-08 DIAGNOSIS — R51 Headache: Secondary | ICD-10-CM

## 2011-02-08 MED ORDER — LEVOTHYROXINE SODIUM 50 MCG PO TABS: 50.0000 ug | ORAL_TABLET | Freq: Every day | ORAL | Status: DC

## 2011-02-08 NOTE — Patient Instructions (Signed)
Take all new medications as prescribed Continue all other medications as before Please return in 2 mo with Lab testing done 3-5 days before

## 2011-02-09 ENCOUNTER — Encounter: Payer: Self-pay | Admitting: Internal Medicine

## 2011-02-09 DIAGNOSIS — R519 Headache, unspecified: Secondary | ICD-10-CM | POA: Insufficient documentation

## 2011-02-09 DIAGNOSIS — R51 Headache: Secondary | ICD-10-CM | POA: Insufficient documentation

## 2011-02-09 NOTE — Assessment & Plan Note (Signed)
stable overall by hx and exam, most recent lab reviewed with pt, and pt to continue medical treatment as before 

## 2011-02-09 NOTE — Assessment & Plan Note (Signed)
stable overall by hx and exam, most recent lab reviewed with pt, and pt to continue medical treatment as before, to f/u labs in June as planned  Lab Results  Component Value Date   WBC 7.5 04/21/2010   HGB 14.5 04/21/2010   HCT 42.1 04/21/2010   PLT 198.0 04/21/2010   CHOL 366* 04/21/2010   TRIG 145.0 04/21/2010   HDL 53.20 04/21/2010   LDLDIRECT 287.7 04/21/2010   ALT 11 04/21/2010   AST 16 04/21/2010   NA 141 04/21/2010   K 4.3 04/21/2010   CL 105 04/21/2010   CREATININE 0.9 04/21/2010   BUN 17 04/21/2010   CO2 29 04/21/2010   TSH 5.09 04/21/2010

## 2011-02-09 NOTE — Assessment & Plan Note (Signed)
stable overall by hx and exam, most recent lab reviewed with pt, and pt to continue medical treatment as before  Lab Results  Component Value Date   TSH 5.09 04/21/2010

## 2011-02-09 NOTE — Assessment & Plan Note (Signed)
stable overall by hx and exam, most recent lab reviewed with pt, and pt to continue medical treatment as before, mild elev today likely due tension over her concerns today  BP Readings from Last 3 Encounters:  02/08/11 152/78  09/30/10 122/70  04/21/10 120/80

## 2011-02-09 NOTE — Progress Notes (Signed)
Subjective:    Patient ID: Jennifer Andrade, female    DOB: 29-Jul-1951, 60 y.o.   MRN: [id number]  HPI  Here to f/u;  Overall doing ok, but c/o daily headache she refers to as migraine (throbbing, blurred vision, nausea without vomiting and photophobia lasting hours each day) for 1 wk exactly starting with taking a refill of her thyroid medication that was labeled levothyroid, instead of the prior bottle labeled levothyroxine;  Her pharmacy supplied with a bottle with a different lable and different generic med, and she is requesting refill of this particular generic med as with taking this the last several days her headaches have resolved.  Pt denies chest pain, increased sob or doe, wheezing, orthopnea, PND, increased LE swelling, palpitations, dizziness or syncope.  Pt denies new neurological symptoms such as other headache, or facial or extremity weakness or numbness.   Pt denies polydipsia, polyuria  Pt states overall good compliance with meds.  Denies worsening depressive symptoms, suicidal ideation, or panic, though has ongoing anxiety, not increased recently.  Denies hyper or hypo thyroid symptoms such as voice, skin or hair change. States Bp at home usually better controlled < 140  Past Medical History  Diagnosis Date  . HYPOTHYROIDISM 04/15/2008  . HYPERLIPIDEMIA 04/15/2008  . ANEMIA-IRON DEFICIENCY 04/15/2008  . ANXIETY 04/15/2008  . Other specified forms of hearing loss 09/30/2010  . HYPERTENSION 04/15/2008  . SINUSITIS- ACUTE-NOS 09/30/2010  . RENAL INSUFFICIENCY 03/11/2009  . LUMBAR RADICULOPATHY, LEFT 11/24/2009  . OSTEOPENIA 04/15/2008   Past Surgical History  Procedure Date  . Tubal ligation     reports that she has quit smoking. She does not have any smokeless tobacco history on file. She reports that she drinks alcohol. Her drug history not on file. family history includes Alcohol abuse in her other; Arthritis in her other; Cancer in her other; Diabetes in her other; Heart disease in  her other; Hypertension in her other; and Stroke in her other. Allergies  Allergen Reactions  . Atorvastatin     REACTION: myalgias  . Rosuvastatin     REACTION: memory problem   Current Outpatient Prescriptions on File Prior to Visit  Medication Sig Dispense Refill  . amLODipine (NORVASC) 5 MG tablet Take 5 mg by mouth daily.        Marland Kitchen aspirin 81 MG EC tablet Take 81 mg by mouth daily.        Marland Kitchen ezetimibe (ZETIA) 10 MG tablet Take 10 mg by mouth daily.        Marland Kitchen levofloxacin (LEVAQUIN) 500 MG tablet Take 500 mg by mouth daily.         Review of Systems Review of Systems  Constitutional: Negative for diaphoresis and unexpected weight change.  HENT: Negative for drooling and tinnitus.   Eyes: Negative for photophobia and visual disturbance.  Respiratory: Negative for choking and stridor.   Gastrointestinal: Negative for vomiting and blood in stool.  Genitourinary: Negative for hematuria and decreased urine volume.  Musculoskeletal: Negative for gait problem.  Skin: Negative for color change and wound.  Neurological: Negative for tremors and numbness.  Psychiatric/Behavioral: Negative for decreased concentration. The patient is not hyperactive.       Objective:   Physical Exam BP 152/78  Pulse 72  Temp(Src) 98.4 F (36.9 C) (Oral)  Ht 5\' 5"  (1.651 m)  Wt 121 lb 6 oz (55.055 kg)  BMI 20.20 kg/m2  SpO2 97% Physical Exam  VS noted Constitutional: Pt appears well-developed and well-nourished.  HENT:  Head: Normocephalic.  Right Ear: External ear normal.  Left Ear: External ear normal.  Eyes: Conjunctivae and EOM are normal. Pupils are equal, round, and reactive to light.  Neck: Normal range of motion. Neck supple.  Cardiovascular: Normal rate and regular rhythm.   Pulmonary/Chest: Effort normal and breath sounds normal.  Abd:  Soft, NT, non-distended, + BS Neurological: Pt is alert. No cranial nerve deficit. Motor/sent/dtr's intact  Skin: Skin is warm. No erythema.    Psychiatric: Pt behavior is normal. Thought content normal.  1+ nervous       Assessment & Plan:

## 2011-02-09 NOTE — Assessment & Plan Note (Signed)
C/w migraine, hard to say if really related to the change in generic med, but could be related to a filler in the pill; HA's now improved, to cont current generic thyroid med

## 2011-02-21 ENCOUNTER — Encounter: Payer: Self-pay | Admitting: Internal Medicine

## 2011-02-21 ENCOUNTER — Ambulatory Visit (INDEPENDENT_AMBULATORY_CARE_PROVIDER_SITE_OTHER): Payer: Managed Care, Other (non HMO) | Admitting: Internal Medicine

## 2011-02-21 DIAGNOSIS — I10 Essential (primary) hypertension: Secondary | ICD-10-CM

## 2011-02-21 DIAGNOSIS — R51 Headache: Secondary | ICD-10-CM

## 2011-02-21 DIAGNOSIS — F411 Generalized anxiety disorder: Secondary | ICD-10-CM

## 2011-02-21 DIAGNOSIS — E559 Vitamin D deficiency, unspecified: Secondary | ICD-10-CM | POA: Insufficient documentation

## 2011-02-21 MED ORDER — PROPRANOLOL HCL ER 60 MG PO CP24: 60.0000 mg | ORAL_CAPSULE | Freq: Every day | ORAL | Status: DC

## 2011-02-21 NOTE — Assessment & Plan Note (Signed)
Uncontrolled - to add the inderal LA for better control  BP Readings from Last 3 Encounters:  02/21/11 150/82  02/08/11 152/78  09/30/10 122/70    and f/u at home and next visit

## 2011-02-21 NOTE — Progress Notes (Signed)
Subjective:    Patient ID: Jennifer Andrade, female    DOB: [birth year], 60 y.o.   MRN: [id number]  HPI  Here to f/u;  Did somewhat better with thyroid med change last visit with less HA per pt, but then 3 days later seemed to start with the dialy HAs again;  Has some pain most days, mild, frontal, radiates across the top of the head, intermittent, assoc with crackling of the neck and tightness,  Assoc with burping as well, and lightsensitive when the pain starts; fortunatetly HA 's so far not debiliating as they have been a few yrs ago with n/v (no n/v this time and overall pain mild per pt).  Not tried Any OTC meds such as advil migraine or other.  Does not feel she Has to take a med for it b/c not so severe, but wearing sunglasses in the office in case the pain starts and she becomes light sensitve while here.  BP at home has been < 140/90, but in the past wk now elevated twice here in the office.    Pt denies chest pain, increased sob or doe, wheezing, orthopnea, PND, increased LE swelling, palpitations, dizziness or syncope.  Pt denies new neurological symptoms such as new headache, or facial or extremity weakness or numbness   Pt denies polydipsia, polyuria. Denies worsening depressive symptoms, suicidal ideation, or panic, though has ongoing anxiety, not increased recently.   Past Medical History  Diagnosis Date  . HYPOTHYROIDISM 04/15/2008  . HYPERLIPIDEMIA 04/15/2008  . ANEMIA-IRON DEFICIENCY 04/15/2008  . ANXIETY 04/15/2008  . Other specified forms of hearing loss 09/30/2010  . HYPERTENSION 04/15/2008  . SINUSITIS- ACUTE-NOS 09/30/2010  . RENAL INSUFFICIENCY 03/11/2009  . LUMBAR RADICULOPATHY, LEFT 11/24/2009  . OSTEOPENIA 04/15/2008   Past Surgical History  Procedure Date  . Tubal ligation     reports that she has quit smoking. She does not have any smokeless tobacco history on file. She reports that she drinks alcohol. Her drug history not on file. family history includes Alcohol abuse in her  other; Arthritis in her other; Cancer in her other; Diabetes in her other; Heart disease in her other; Hypertension in her other; and Stroke in her other. Allergies  Allergen Reactions  . Atorvastatin     REACTION: myalgias  . Rosuvastatin     REACTION: memory problem   Current Outpatient Prescriptions on File Prior to Visit  Medication Sig Dispense Refill  . amLODipine (NORVASC) 5 MG tablet Take 5 mg by mouth daily.        Marland Kitchen aspirin 81 MG EC tablet Take 81 mg by mouth daily.        Marland Kitchen ezetimibe (ZETIA) 10 MG tablet Take 10 mg by mouth daily.        Marland Kitchen levothyroxine (SYNTHROID) 50 MCG tablet Take 1 tablet (50 mcg total) by mouth daily.  90 tablet  3  . levofloxacin (LEVAQUIN) 500 MG tablet Take 500 mg by mouth daily.         Review of Systems Review of Systems  Constitutional: Negative for diaphoresis and unexpected weight change.  HENT: Negative for drooling and tinnitus.   Eyes: Negative for photophobia and visual disturbance.  Respiratory: Negative for choking and stridor.   Gastrointestinal: Negative for vomiting and blood in stool.  Genitourinary: Negative for hematuria and decreased urine volume.  Musculoskeletal: Negative for gait problem.  Skin: Negative for color change and wound.  Neurological: Negative for tremors and numbness.  Psychiatric/Behavioral: Negative for decreased  concentration. The patient is not hyperactive.   \    Objective:   Physical Exam BP 150/82  Pulse 70  Temp(Src) 97.9 F (36.6 C) (Oral)  Ht 5\' 6"  (1.676 m)  Wt 123 lb 6 oz (55.963 kg)  BMI 19.91 kg/m2  SpO2 97% Physical Exam  VS noted Constitutional: Pt appears well-developed and well-nourished.  HENT: Head: Normocephalic.  Right Ear: External ear normal.  Left Ear: External ear normal.  Eyes: Conjunctivae and EOM are normal. Pupils are equal, round, and reactive to light.  Neck: Normal range of motion. Neck supple.  Cardiovascular: Normal rate and regular rhythm.   Pulmonary/Chest: Effort  normal and breath sounds normal.  Abd:  Soft, NT, non-distended, + BS Neurological: Pt is alert. No cranial nerve deficit.  Skin: Skin is warm. No erythema.  Psychiatric: Pt behavior is normal. Thought content normal. 1+ nervous       Assessment & Plan:

## 2011-02-21 NOTE — Patient Instructions (Signed)
Take all new medications as prescribed Continue all other medications as before Please continue to monitor your BP at home as you do Please follow low salt diet, inaddition to lower cholesterol Please return in June 2012 as you have planned

## 2011-02-21 NOTE — Assessment & Plan Note (Signed)
Stable  Lab Results  Component Value Date   WBC 7.5 04/21/2010   HGB 14.5 04/21/2010   HCT 42.1 04/21/2010   PLT 198.0 04/21/2010   CHOL 366* 04/21/2010   TRIG 145.0 04/21/2010   HDL 53.20 04/21/2010   LDLDIRECT 287.7 04/21/2010   ALT 11 04/21/2010   AST 16 04/21/2010   NA 141 04/21/2010   K 4.3 04/21/2010   CL 105 04/21/2010   CREATININE 0.9 04/21/2010   BUN 17 04/21/2010   CO2 29 04/21/2010   TSH 5.09 04/21/2010

## 2011-02-21 NOTE — Assessment & Plan Note (Signed)
C/w tension vs mild daily migraine - for the inderal LA for prevention as well, and excedrin migraine prn

## 2011-03-18 ENCOUNTER — Other Ambulatory Visit: Payer: Self-pay

## 2011-03-22 ENCOUNTER — Other Ambulatory Visit (INDEPENDENT_AMBULATORY_CARE_PROVIDER_SITE_OTHER): Payer: Managed Care, Other (non HMO)

## 2011-03-22 ENCOUNTER — Other Ambulatory Visit (INDEPENDENT_AMBULATORY_CARE_PROVIDER_SITE_OTHER): Payer: Managed Care, Other (non HMO) | Admitting: Internal Medicine

## 2011-03-22 DIAGNOSIS — Z13220 Encounter for screening for lipoid disorders: Secondary | ICD-10-CM

## 2011-03-22 DIAGNOSIS — Z0000 Encounter for general adult medical examination without abnormal findings: Secondary | ICD-10-CM

## 2011-03-22 LAB — CBC WITH DIFFERENTIAL/PLATELET
Basophils Relative: 0.7 % (ref 0.0–3.0)
Eosinophils Relative: 2.4 % (ref 0.0–5.0)
Hemoglobin: 15.4 g/dL — ABNORMAL HIGH (ref 12.0–15.0)
Lymphocytes Relative: 30.1 % (ref 12.0–46.0)
MCV: 93.4 fl (ref 78.0–100.0)
Neutrophils Relative %: 56 % (ref 43.0–77.0)
RBC: 4.81 Mil/uL (ref 3.87–5.11)
WBC: 7.1 10*3/uL (ref 4.5–10.5)

## 2011-03-22 LAB — URINALYSIS, ROUTINE W REFLEX MICROSCOPIC
Nitrite: NEGATIVE
Total Protein, Urine: NEGATIVE
pH: 5 (ref 5.0–8.0)

## 2011-03-22 LAB — LIPID PANEL
Cholesterol: 244 mg/dL — ABNORMAL HIGH (ref 0–200)
HDL: 49.7 mg/dL (ref >39.00)
Triglycerides: 136 mg/dL (ref 0.0–149.0)
VLDL: 27.2 mg/dL (ref 0.0–40.0)

## 2011-03-22 LAB — HEPATIC FUNCTION PANEL
ALT: 10 U/L (ref 0–35)
Total Bilirubin: 0.7 mg/dL (ref 0.3–1.2)

## 2011-03-22 LAB — BASIC METABOLIC PANEL
BUN: 26 mg/dL — ABNORMAL HIGH (ref 6–23)
Calcium: 10 mg/dL (ref 8.4–10.5)
Chloride: 105 mEq/L (ref 96–112)
Creatinine, Ser: 1.3 mg/dL — ABNORMAL HIGH (ref 0.4–1.2)

## 2011-03-25 ENCOUNTER — Ambulatory Visit (INDEPENDENT_AMBULATORY_CARE_PROVIDER_SITE_OTHER): Payer: Managed Care, Other (non HMO) | Admitting: Internal Medicine

## 2011-03-25 ENCOUNTER — Encounter: Payer: Self-pay | Admitting: Internal Medicine

## 2011-03-25 VITALS — BP 120/72 | HR 52 | Temp 98.0°F | Ht 66.0 in | Wt 121.1 lb

## 2011-03-25 DIAGNOSIS — I10 Essential (primary) hypertension: Secondary | ICD-10-CM

## 2011-03-25 DIAGNOSIS — G43909 Migraine, unspecified, not intractable, without status migrainosus: Secondary | ICD-10-CM

## 2011-03-25 DIAGNOSIS — E785 Hyperlipidemia, unspecified: Secondary | ICD-10-CM

## 2011-03-25 DIAGNOSIS — N259 Disorder resulting from impaired renal tubular function, unspecified: Secondary | ICD-10-CM

## 2011-03-25 DIAGNOSIS — G43009 Migraine without aura, not intractable, without status migrainosus: Secondary | ICD-10-CM | POA: Insufficient documentation

## 2011-03-25 DIAGNOSIS — Z0000 Encounter for general adult medical examination without abnormal findings: Secondary | ICD-10-CM

## 2011-03-25 NOTE — Assessment & Plan Note (Signed)
Improved but still signficiant persistent;  Will refer to neurology to consider botox treatments

## 2011-03-25 NOTE — Assessment & Plan Note (Signed)
Still severe uncontrolled;  To cont the zetia, and I suggested livalo but she declines all statins at this time

## 2011-03-25 NOTE — Assessment & Plan Note (Signed)

## 2011-03-25 NOTE — Assessment & Plan Note (Signed)
Recent worsening, unclear etiology, for renal u/s, and re-check bmet 1 wk

## 2011-03-25 NOTE — Patient Instructions (Addendum)
Please remember to followup with your GYN for the yearly pap smear and/or mammogram - please consider [facility]  You will be contacted regarding the referral for: colonoscopy, the kidney ultrasound, and Dr Yan/neurology to consider botox for the migraines Please return in 1 wk for LAB only:  bmet  Please call the phone number [phone] (the PhoneTree System) for results of testing in 2-3 days;  When calling, simply dial the number, and when prompted enter the MRN number above (the Medical Record Number) and the # key, then the message should start. Please follow lower cholesterol diet Please call if you change your mind about starting Livalo for cholesterol before next visit Please return in 6 mo with Lab testing done 3-5 days before

## 2011-03-25 NOTE — Assessment & Plan Note (Signed)
stable overall by hx and exam, most recent data reviewed with pt, and pt to continue medical treatment as before  BP Readings from Last 3 Encounters:  03/25/11 120/72  02/21/11 150/82  02/08/11 152/78

## 2011-03-25 NOTE — Progress Notes (Signed)
Subjective:    Patient ID: Jennifer Andrade, female    DOB: December 12, 1950, 59 y.o.   MRN: [id number]  HPI  Here for wellness and f/u;  Overall doing ok;  Pt denies CP, worsening SOB, DOE, wheezing, orthopnea, PND, worsening LE edema, palpitations, dizziness or syncope.  Pt denies neurological change such as new Headache, facial or extremity weakness.  Pt denies polydipsia, polyuria, or low sugar symptoms. Pt states overall good compliance with treatment and medications, good tolerability, and trying to follow lower cholesterol diet.  Pt denies worsening depressive symptoms, suicidal ideation or panic. No fever, wt loss, night sweats, loss of appetite, or other constitutional symptoms.  Pt states good ability with ADL's, low fall risk, home safety reviewed and adequate, no significant changes in hearing or vision, and occasionally active with exercise.  Current med tx has improved migraine daily headache, but unfortunately though less frequent, seem more severe, now at 2-4 HA per wk, lasting hours.    Denies urinary symptoms such as dysuria, frequency, urgency,or hematuria. Past Medical History  Diagnosis Date  . HYPOTHYROIDISM 04/15/2008  . HYPERLIPIDEMIA 04/15/2008  . ANEMIA-IRON DEFICIENCY 04/15/2008  . ANXIETY 04/15/2008  . Other specified forms of hearing loss 09/30/2010  . HYPERTENSION 04/15/2008  . SINUSITIS- ACUTE-NOS 09/30/2010  . RENAL INSUFFICIENCY 03/11/2009  . LUMBAR RADICULOPATHY, LEFT 11/24/2009  . OSTEOPENIA 04/15/2008  . Migraine 03/25/2011   Past Surgical History  Procedure Date  . Tubal ligation     reports that she has quit smoking. She does not have any smokeless tobacco history on file. She reports that she drinks alcohol. Her drug history not on file. family history includes Alcohol abuse in her other; Arthritis in her other; Cancer in her other; Diabetes in her other; Heart disease in her other; Hypertension in her other; and Stroke in her other. Allergies  Allergen Reactions  .  Atorvastatin     REACTION: myalgias  . Rosuvastatin     REACTION: memory problem   Current Outpatient Prescriptions on File Prior to Visit  Medication Sig Dispense Refill  . amLODipine (NORVASC) 5 MG tablet Take 5 mg by mouth daily.        Marland Kitchen aspirin 81 MG EC tablet Take 81 mg by mouth daily.        Marland Kitchen ezetimibe (ZETIA) 10 MG tablet Take 10 mg by mouth daily.        Marland Kitchen levothyroxine (SYNTHROID) 50 MCG tablet Take 1 tablet (50 mcg total) by mouth daily.  90 tablet  3  . levofloxacin (LEVAQUIN) 500 MG tablet Take 500 mg by mouth daily.        . propranolol (INDERAL LA) 60 MG 24 hr capsule Take 1 capsule (60 mg total) by mouth daily.  30 capsule  11   Review of Systems Review of Systems  Constitutional: Negative for diaphoresis, activity change, appetite change and unexpected weight change.  HENT: Negative for hearing loss, ear pain, facial swelling, mouth sores and neck stiffness.   Eyes: Negative for pain, redness and visual disturbance.  Respiratory: Negative for shortness of breath and wheezing.   Cardiovascular: Negative for chest pain and palpitations.  Gastrointestinal: Negative for diarrhea, blood in stool, abdominal distention and rectal pain.  Genitourinary: Negative for hematuria, flank pain and decreased urine volume.  Musculoskeletal: Negative for myalgias and joint swelling.  Skin: Negative for color change and wound.  Neurological: Negative for syncope and numbness.  Hematological: Negative for adenopathy.  Psychiatric/Behavioral: Negative for hallucinations, self-injury, decreased concentration and  agitation.      Objective:   Physical Exam BP 120/72  Pulse 52  Temp(Src) 98 F (36.7 C) (Oral)  Ht 5\' 6"  (1.676 m)  Wt 121 lb 2 oz (54.942 kg)  BMI 19.55 kg/m2  SpO2 97% Physical Exam  VS noted Constitutional: Pt is oriented to person, place, and time. Appears well-developed but relatively thin, frail  HENT:  Head: Normocephalic and atraumatic.  Right Ear: External  ear normal.  Left Ear: External ear normal.  Nose: Nose normal.  Mouth/Throat: Oropharynx is clear and moist.  Eyes: Conjunctivae and EOM are normal. Pupils are equal, round, and reactive to light.  Neck: Normal range of motion. Neck supple. No JVD present. No tracheal deviation present.  Cardiovascular: Normal rate, regular rhythm, normal heart sounds and intact distal pulses.   Pulmonary/Chest: Effort normal and breath sounds normal.  Abdominal: Soft. Bowel sounds are normal. There is no tenderness.  Musculoskeletal: Normal range of motion. Exhibits no edema.  Lymphadenopathy:  Has no cervical adenopathy.  Neurological: Pt is alert and oriented to person, place, and time. Pt has normal reflexes. No cranial nerve deficit.  Skin: Skin is warm and dry. No rash noted.  Psychiatric:  Has  normal mood and affect. Behavior is normal. 1-2+ nervous        Assessment & Plan:

## 2011-03-31 ENCOUNTER — Encounter: Payer: Self-pay | Admitting: Internal Medicine

## 2011-04-15 ENCOUNTER — Encounter: Payer: Managed Care, Other (non HMO) | Admitting: Cardiology

## 2011-04-15 ENCOUNTER — Other Ambulatory Visit: Payer: Self-pay | Admitting: Internal Medicine

## 2011-04-18 ENCOUNTER — Encounter: Payer: Managed Care, Other (non HMO) | Admitting: Cardiology

## 2011-05-04 ENCOUNTER — Encounter (INDEPENDENT_AMBULATORY_CARE_PROVIDER_SITE_OTHER): Payer: Managed Care, Other (non HMO) | Admitting: Cardiology

## 2011-05-04 ENCOUNTER — Other Ambulatory Visit: Payer: Self-pay | Admitting: Cardiology

## 2011-05-04 DIAGNOSIS — N189 Chronic kidney disease, unspecified: Secondary | ICD-10-CM

## 2011-05-06 ENCOUNTER — Encounter: Payer: Self-pay | Admitting: Internal Medicine

## 2011-05-06 NOTE — Progress Notes (Signed)
Quick Note:  Voice message left on [employer] system - lab is negative, normal or otherwise stable, pt to continue same tx ______ 

## 2011-05-10 ENCOUNTER — Ambulatory Visit (AMBULATORY_SURGERY_CENTER): Payer: Managed Care, Other (non HMO)

## 2011-05-10 ENCOUNTER — Encounter: Payer: Self-pay | Admitting: Internal Medicine

## 2011-05-10 VITALS — Ht 67.0 in | Wt 121.7 lb

## 2011-05-10 DIAGNOSIS — Z1211 Encounter for screening for malignant neoplasm of colon: Secondary | ICD-10-CM

## 2011-05-10 MED ORDER — PEG-KCL-NACL-NASULF-NA ASC-C 100 G PO SOLR: 1.0000 | Freq: Once | ORAL | Status: AC

## 2011-05-10 MED ORDER — PEG-KCL-NACL-NASULF-NA ASC-C 100 G PO SOLR: 1.0000 | Freq: Once | ORAL | Status: DC

## 2011-05-23 ENCOUNTER — Encounter: Payer: Self-pay | Admitting: Internal Medicine

## 2011-05-23 ENCOUNTER — Telehealth: Payer: Self-pay | Admitting: Internal Medicine

## 2011-05-23 NOTE — Telephone Encounter (Signed)
OK not to charge her. Please clear the schedule so we can put somebody else there. Thanx DB

## 2011-05-24 ENCOUNTER — Other Ambulatory Visit: Payer: Managed Care, Other (non HMO) | Admitting: Internal Medicine

## 2011-07-12 ENCOUNTER — Other Ambulatory Visit: Payer: Managed Care, Other (non HMO) | Admitting: Internal Medicine

## 2011-08-02 ENCOUNTER — Other Ambulatory Visit: Payer: Self-pay

## 2011-08-02 DIAGNOSIS — E039 Hypothyroidism, unspecified: Secondary | ICD-10-CM

## 2011-08-02 DIAGNOSIS — I10 Essential (primary) hypertension: Secondary | ICD-10-CM

## 2011-08-02 MED ORDER — LEVOTHYROXINE SODIUM 50 MCG PO TABS: 50.0000 ug | ORAL_TABLET | Freq: Every day | ORAL | Status: DC

## 2011-08-02 MED ORDER — AMLODIPINE BESYLATE 5 MG PO TABS: 5.0000 mg | ORAL_TABLET | Freq: Every day | ORAL | Status: DC

## 2011-08-02 MED ORDER — PROPRANOLOL HCL ER 60 MG PO CP24: 60.0000 mg | ORAL_CAPSULE | Freq: Every day | ORAL | Status: DC

## 2011-08-02 MED ORDER — EZETIMIBE 10 MG PO TABS: 10.0000 mg | ORAL_TABLET | Freq: Every day | ORAL | Status: DC

## 2011-08-08 ENCOUNTER — Other Ambulatory Visit: Payer: Self-pay

## 2011-08-08 DIAGNOSIS — I10 Essential (primary) hypertension: Secondary | ICD-10-CM

## 2011-08-08 MED ORDER — PROPRANOLOL HCL ER 60 MG PO CP24: 60.0000 mg | ORAL_CAPSULE | Freq: Every day | ORAL | Status: DC

## 2011-08-08 MED ORDER — AMLODIPINE BESYLATE 5 MG PO TABS: 5.0000 mg | ORAL_TABLET | Freq: Every day | ORAL | Status: DC

## 2011-08-08 MED ORDER — EZETIMIBE 10 MG PO TABS: 10.0000 mg | ORAL_TABLET | Freq: Every day | ORAL | Status: DC

## 2011-09-26 ENCOUNTER — Ambulatory Visit: Payer: Managed Care, Other (non HMO) | Admitting: Internal Medicine

## 2011-11-03 ENCOUNTER — Ambulatory Visit (INDEPENDENT_AMBULATORY_CARE_PROVIDER_SITE_OTHER): Payer: Managed Care, Other (non HMO) | Admitting: Internal Medicine

## 2011-11-03 ENCOUNTER — Encounter: Payer: Self-pay | Admitting: *Deleted

## 2011-11-03 ENCOUNTER — Encounter: Payer: Self-pay | Admitting: Internal Medicine

## 2011-11-03 VITALS — BP 110/84 | HR 54 | Temp 97.4°F | Ht 66.0 in | Wt 127.5 lb

## 2011-11-03 DIAGNOSIS — M79609 Pain in unspecified limb: Secondary | ICD-10-CM

## 2011-11-03 DIAGNOSIS — M79606 Pain in leg, unspecified: Secondary | ICD-10-CM

## 2011-11-03 DIAGNOSIS — Z0000 Encounter for general adult medical examination without abnormal findings: Secondary | ICD-10-CM

## 2011-11-03 DIAGNOSIS — E785 Hyperlipidemia, unspecified: Secondary | ICD-10-CM

## 2011-11-03 DIAGNOSIS — R21 Rash and other nonspecific skin eruption: Secondary | ICD-10-CM

## 2011-11-03 MED ORDER — CLOBETASOL PROPIONATE 0.05 % EX CREA: TOPICAL_CREAM | Freq: Two times a day (BID) | CUTANEOUS | Status: AC

## 2011-11-03 MED ORDER — COLESEVELAM HCL 625 MG PO TABS: 1875.0000 mg | ORAL_TABLET | Freq: Two times a day (BID) | ORAL | Status: DC

## 2011-11-03 MED ORDER — HYDROCORTISONE 2.5 % EX CREA: TOPICAL_CREAM | Freq: Two times a day (BID) | CUTANEOUS | Status: AC

## 2011-11-03 NOTE — Patient Instructions (Signed)
Take all new medications as prescribed - the welchol (sent [facility]), and the creams (done hardcopy) Continue all other medications as before Please follow lower cholesterol diet, and quit smoking Please return in 6 mo with Lab testing done 3-5 days before

## 2011-11-06 ENCOUNTER — Encounter: Payer: Self-pay | Admitting: Internal Medicine

## 2011-11-06 DIAGNOSIS — R21 Rash and other nonspecific skin eruption: Secondary | ICD-10-CM | POA: Insufficient documentation

## 2011-11-06 DIAGNOSIS — M79606 Pain in leg, unspecified: Secondary | ICD-10-CM | POA: Insufficient documentation

## 2011-11-06 NOTE — Assessment & Plan Note (Signed)
C/w dermatitis, for hydrocort topical asd,  to f/u any worsening symptoms or concerns, consider derm if not improved

## 2011-11-06 NOTE — Assessment & Plan Note (Signed)
C/w prob lateral fasciitis, exam o/w bening,  to f/u any worsening symptoms or concerns, for tylenol prn, reassurance

## 2011-11-06 NOTE — Assessment & Plan Note (Signed)
Severe, on zetia, to add welchol asd as she is stain intol,  to f/u any worsening symptoms or concerns

## 2011-11-06 NOTE — Progress Notes (Signed)
Subjective:    Patient ID: Jennifer Andrade, female    DOB: [birth year], 61 y.o.   MRN: [id number]  HPI  Here to f/u; overall doing ok except for mild to mod 2 mo onset pain/tender left lateral leg without back or other leg pain, no falls, not worse to lie on left side but worse to stand up to walk;  Nothing makes better;  No trauma or falls.  Also mild facial rash without tender, fever for several wks.  Trying to follow lower chol diet.  Pt denies chest pain, increased sob or doe, wheezing, orthopnea, PND, increased LE swelling, palpitations, dizziness or syncope.  Pt denies new neurological symptoms such as new headache, or facial or extremity weakness or numbness   Pt denies polydipsia, polyuria.  Pt denies fever, wt loss, night sweats, loss of appetite, or other constitutional symptoms.  Denies worsening depressive symptoms, suicidal ideation, or panic. Past Medical History  Diagnosis Date  . HYPOTHYROIDISM 04/15/2008  . HYPERLIPIDEMIA 04/15/2008  . ANEMIA-IRON DEFICIENCY 04/15/2008  . ANXIETY 04/15/2008  . Other specified forms of hearing loss 09/30/2010  . HYPERTENSION 04/15/2008  . SINUSITIS- ACUTE-NOS 09/30/2010  . RENAL INSUFFICIENCY 03/11/2009  . LUMBAR RADICULOPATHY, LEFT 11/24/2009  . OSTEOPENIA 04/15/2008  . Migraine 03/25/2011  . Cataract    Past Surgical History  Procedure Date  . Tubal ligation   . Cataract surg     lens replacement Bil    reports that she has been smoking Cigarettes.  She has been smoking about 1 pack per day. She has never used smokeless tobacco. She reports that she does not drink alcohol or use illicit drugs. family history includes Alcohol abuse in her other; Arthritis in her other; Cancer in her other; Diabetes in her other; Heart disease in her father and other; Hypertension in her other; Kidney disease in her father; and Stroke in her other. Allergies  Allergen Reactions  . Atorvastatin     REACTION: myalgias  . Rosuvastatin     REACTION: memory problem    Current Outpatient Prescriptions on File Prior to Visit  Medication Sig Dispense Refill  . amLODipine (NORVASC) 5 MG tablet Take 1 tablet (5 mg total) by mouth daily.  90 tablet  2  . ezetimibe (ZETIA) 10 MG tablet Take 1 tablet (10 mg total) by mouth daily.  90 tablet  2  . levothyroxine (SYNTHROID) 50 MCG tablet Take 1 tablet (50 mcg total) by mouth daily.  90 tablet  2  . propranolol (INDERAL LA) 60 MG 24 hr capsule Take 1 capsule (60 mg total) by mouth daily.  90 capsule  2  . aspirin 81 MG EC tablet Take 81 mg by mouth daily.         Review of Systems Review of Systems  Constitutional: Negative for diaphoresis and unexpected weight change.  HENT: Negative for drooling and tinnitus.   Eyes: Negative for photophobia and visual disturbance.  Respiratory: Negative for choking and stridor.   Gastrointestinal: Negative for vomiting and blood in stool.  Genitourinary: Negative for hematuria and decreased urine volume.     Objective:   Physical Exam BP 110/84  Pulse 54  Temp(Src) 97.4 F (36.3 C) (Oral)  Ht 5\' 6"  (1.676 m)  Wt 127 lb 8 oz (57.834 kg)  BMI 20.58 kg/m2  SpO2 95% Physical Exam  VS noted Constitutional: Pt appears well-developed and well-nourished.  HENT: Head: Normocephalic.  Right Ear: External ear normal.  Left Ear: External ear normal.  Eyes: Conjunctivae  and EOM are normal. Pupils are equal, round, and reactive to light.  Neck: Normal range of motion. Neck supple.  Cardiovascular: Normal rate and regular rhythm.   Pulmonary/Chest: Effort normal and breath sounds normal.  Abd:  Soft, NT, non-distended, + BS Neurological: Pt is alert. No cranial nerve deficit.  Skin: Skin is warm. No erythema. except midl erythema about the nose and lips, nontender Psychiatric: Pt behavior is normal. Thought content normal. 1-2+ nervous Mild tender left lateral mid leg, neg tender over greater trochanter    Assessment & Plan:

## 2011-11-07 ENCOUNTER — Other Ambulatory Visit (HOSPITAL_COMMUNITY)
Admission: RE | Admit: 2011-11-07 | Discharge: 2011-11-07 | Disposition: A | Discharge status: Home / Self Care | Payer: Managed Care, Other (non HMO) | Source: Ambulatory Visit | Attending: Gynecology | Admitting: Gynecology

## 2011-11-07 ENCOUNTER — Encounter: Payer: Self-pay | Admitting: Gynecology

## 2011-11-07 ENCOUNTER — Ambulatory Visit (INDEPENDENT_AMBULATORY_CARE_PROVIDER_SITE_OTHER): Payer: Managed Care, Other (non HMO) | Admitting: Gynecology

## 2011-11-07 VITALS — BP 128/80 | Ht 66.0 in | Wt 126.5 lb

## 2011-11-07 DIAGNOSIS — Z01419 Encounter for gynecological examination (general) (routine) without abnormal findings: Secondary | ICD-10-CM

## 2011-11-07 DIAGNOSIS — M899 Disorder of bone, unspecified: Secondary | ICD-10-CM

## 2011-11-07 DIAGNOSIS — M8580 Other specified disorders of bone density and structure, unspecified site: Secondary | ICD-10-CM

## 2011-11-07 DIAGNOSIS — E559 Vitamin D deficiency, unspecified: Secondary | ICD-10-CM

## 2011-11-07 NOTE — Progress Notes (Signed)
Jennifer Andrade [birth year] [id number]        61 y.o.  Presents with her husband to discuss decreased libido and dyspareunia. She has not been in for several years.  With several issues as noted below.   Past medical history,surgical history, medications, allergies, family history and social history were all reviewed and documented in the EPIC chart. ROS:  Was performed and pertinent positives and negatives are included in the history.  Exam: Jennifer Andrade chaperone present Filed Vitals:   11/07/11 1545  BP: 128/80   General appearance  Normal Skin grossly normal Head/Neck normal with no cervical or supraclavicular adenopathy thyroid normal Lungs  clear Cardiac RR, without RMG Abdominal  soft, nontender, without masses, organomegaly or hernia Breasts  examined lying and sitting without masses, retractions, discharge or axillary adenopathy. Pelvic  Ext/BUS/vagina  Atrophic  Cervix  Atrophic in appearance  Pap done  Uterus anteverted, normal size, shape and contour, midline and mobile nontender   Adnexa  Without masses or tenderness    Anus and perineum  normal   Rectovaginal refused  Assessment/Plan:  61 y.o. female for annual exam.    1. Atrophic vaginitis/dyspareunia. Causing significant issues with their marriage. She has tried lubricants moisturizers and these are not helpful. She's not having significant hot flashes sweats or other symptoms.  Options for management were reviewed to include estrogen supplementation. Options for this including oral transdermal transvaginal both cream and Vagifem were all reviewed. The issues of HRT, WHI study risks of stroke heart attack DVT and breast cancer risks were discussed with her and her husband. She does smoke and understands this is an independent risk factor for cardiovascular disease stroke heart attack in DVT. The issues of absorption with vaginal creams was also reviewed and after lengthy discussion she wants a trial of Vagifem. I did review  with her that there is some absorption with this but does not appear to be great and that we will not need to use progesterone at least at this point.  I gave her 24 samples Vagifem 10 mcg to start with 1applicator nightly for 2 weeks and then twice weekly. She can call me in a month to let me know how she is doing. 2. Osteopenia. She has a prior bone density showing a -2.3 DEXA in 2007. She's had no follow up since then and I ordered a DEXA study today she's going to go ahead and do this. She's also been known to have a low vitamin D and I ordered a vitamin D level. Increase calcium vitamin D was reviewed with her. 3. Mammography. It's been years since she has had mammogram. The need to do so now was stressed to both her and her husband she agrees to arrange. SBE monthly discussed. 4. Pap smear. I did a Pap smear today as her follow up has not been consistent. She has no history of significant abnormalities in the past and this point we'll plan on every three-year screening. 5. Colonoscopy. Patient refuses colonoscopy. I stressed the importance of early detection and again she refuses. I've encouraged her to discuss at least with gastroenterology for alternatives. 6. Cigarette smoking. I again discussed stop smoking with her as has Dr. Jonny Andrade. She understands the recommendations and the health risks with smoking. 7. Health maintenance. No blood work was done other than the vitamin D as was all done through Dr. Jonny Andrade who follows her for her medical issues.    Jennifer Lords MD, 4:58 PM 11/07/2011

## 2011-11-07 NOTE — Patient Instructions (Addendum)
Follow up for bone density as we discussed. Strongly recommend a colonoscopy, mammography and to stop smoking.  Call me in follow up after starting the Vagifem.  Consider Stop Smoking.  Help is available at [facility] smoking cessation program @ www.[employer].com or 908-767-8666. OR 1-800-QUIT-NOW 506-459-9074) for free smoking cessation counseling.

## 2011-11-08 ENCOUNTER — Telehealth: Payer: Self-pay

## 2011-11-08 ENCOUNTER — Encounter: Payer: Self-pay | Admitting: Gynecology

## 2011-11-08 LAB — VITAMIN D 25 HYDROXY (VIT D DEFICIENCY, FRACTURES): Vit D, 25-Hydroxy: 16 ng/mL — ABNORMAL LOW (ref 30–89)

## 2011-11-08 MED ORDER — VITAMIN D (ERGOCALCIFEROL) 1.25 MG (50000 UNIT) PO CAPS: 50000.0000 [IU] | ORAL_CAPSULE | ORAL | Status: DC

## 2011-11-08 NOTE — Telephone Encounter (Signed)
Does not appear to be an issue and is not reported as a warning from the drug company.

## 2011-11-08 NOTE — Telephone Encounter (Signed)
Patient informed. 

## 2011-11-08 NOTE — Telephone Encounter (Signed)
Patient was recently prescribed Vagifem.  She asked if her husband could "absorb this"?

## 2011-11-08 NOTE — Progress Notes (Signed)
Addended by: [name] on: 11/08/2011 09:05 AM   Modules accepted: Orders

## 2011-12-14 ENCOUNTER — Ambulatory Visit (INDEPENDENT_AMBULATORY_CARE_PROVIDER_SITE_OTHER): Payer: Managed Care, Other (non HMO) | Admitting: Internal Medicine

## 2011-12-14 ENCOUNTER — Encounter: Payer: Self-pay | Admitting: Internal Medicine

## 2011-12-14 VITALS — BP 110/80 | HR 55 | Temp 97.0°F | Ht 66.0 in | Wt 121.5 lb

## 2011-12-14 DIAGNOSIS — J209 Acute bronchitis, unspecified: Secondary | ICD-10-CM | POA: Insufficient documentation

## 2011-12-14 DIAGNOSIS — I10 Essential (primary) hypertension: Secondary | ICD-10-CM

## 2011-12-14 DIAGNOSIS — F411 Generalized anxiety disorder: Secondary | ICD-10-CM

## 2011-12-14 MED ORDER — AZITHROMYCIN 250 MG PO TABS: ORAL_TABLET | ORAL | Status: AC

## 2011-12-14 MED ORDER — HYDROCODONE-HOMATROPINE 5-1.5 MG/5ML PO SYRP: 5.0000 mL | ORAL_SOLUTION | Freq: Four times a day (QID) | ORAL | Status: AC | PRN

## 2011-12-14 NOTE — Patient Instructions (Signed)
Take all new medications as prescribed Continue all other medications as before You can also take  Mucinex (or it's generic off brand) for congestion  

## 2011-12-14 NOTE — Assessment & Plan Note (Signed)
stable overall by hx and exam, most recent data reviewed with pt, and pt to continue medical treatment as before Lab Results  Component Value Date   WBC 7.1 03/22/2011   HGB 15.4* 03/22/2011   HCT 44.9 03/22/2011   PLT 214.0 03/22/2011   GLUCOSE 102* 03/22/2011   CHOL 244* 03/22/2011   TRIG 136.0 03/22/2011   HDL 49.70 03/22/2011   LDLDIRECT 195.2 03/22/2011   ALT 10 03/22/2011   AST 15 03/22/2011   NA 140 03/22/2011   K 5.4* 03/22/2011   CL 105 03/22/2011   CREATININE 1.3* 03/22/2011   BUN 26* 03/22/2011   CO2 28 03/22/2011   TSH 4.31 03/22/2011

## 2011-12-14 NOTE — Progress Notes (Signed)
Subjective:    Patient ID: Jennifer Andrade, female    DOB: 09/20/1951, 61 y.o.   MRN: [id number]  HPI  Here with acute onset mild to mod 2-3 wks ST, HA, general weakness and malaise, with prod cough greenish sputum, but Pt denies chest pain, increased sob or doe, wheezing, orthopnea, PND, increased LE swelling, palpitations, dizziness or syncope.  Recent tx with cefuroxime did not help, Cant sleep due to cough.  Pt denies new neurological symptoms such as new headache, or facial or extremity weakness or numbness   Pt denies polydipsia, polyuria.  Mucinex DM helps some but only taking once per day.  Denies worsening depressive symptoms, suicidal ideation, or panic, though has ongoing anxiety, not increased recently.    Past Medical History  Diagnosis Date  . HYPOTHYROIDISM 04/15/2008  . HYPERLIPIDEMIA 04/15/2008  . ANEMIA-IRON DEFICIENCY 04/15/2008  . ANXIETY 04/15/2008  . Other specified forms of hearing loss 09/30/2010  . HYPERTENSION 04/15/2008  . SINUSITIS- ACUTE-NOS 09/30/2010  . RENAL INSUFFICIENCY 03/11/2009  . LUMBAR RADICULOPATHY, LEFT 11/24/2009  . OSTEOPENIA 04/15/2008  . Migraine 03/25/2011  . Cataract   . Vitamin d deficiency 10/2011    level 16   Past Surgical History  Procedure Date  . Tubal ligation   . Cataract surg     lens replacement Bil    reports that she has been smoking Cigarettes.  She has been smoking about 1 pack per day. She has never used smokeless tobacco. She reports that she does not drink alcohol or use illicit drugs. family history includes Alcohol abuse in her other; Arthritis in her other; Cancer in her other; Diabetes in her other; Heart disease in her father and other; Hypertension in her father and other; Kidney disease in her father; and Stroke in her other. Allergies  Allergen Reactions  . Atorvastatin     REACTION: myalgias  . Rosuvastatin     REACTION: memory problem   Current Outpatient Prescriptions on File Prior to Visit  Medication Sig Dispense  Refill  . amLODipine (NORVASC) 5 MG tablet Take 1 tablet (5 mg total) by mouth daily.  90 tablet  2  . aspirin 81 MG EC tablet Take 81 mg by mouth daily.        . clobetasol cream (TEMOVATE) 0.05 % Apply topically 2 (two) times daily.  30 g  1  . colesevelam (WELCHOL) 625 MG tablet Take 3 tablets (1,875 mg total) by mouth 2 (two) times daily with a meal.  540 tablet  3  . ezetimibe (ZETIA) 10 MG tablet Take 1 tablet (10 mg total) by mouth daily.  90 tablet  2  . hydrocortisone 2.5 % cream Apply topically 2 (two) times daily.  30 g  1  . levothyroxine (SYNTHROID) 50 MCG tablet Take 1 tablet (50 mcg total) by mouth daily.  90 tablet  2  . propranolol (INDERAL LA) 60 MG 24 hr capsule Take 1 capsule (60 mg total) by mouth daily.  90 capsule  2  . Vitamin D, Ergocalciferol, (DRISDOL) 50000 UNITS CAPS Take 1 capsule (50,000 Units total) by mouth every 7 (seven) days.  12 capsule  0   Review of Systems Review of Systems  Constitutional: Negative for diaphoresis and unexpected weight change.  HENT: Negative for drooling and tinnitus.   Eyes: Negative for photophobia and visual disturbance.  Respiratory: Negative for choking and stridor.   Gastrointestinal: Negative for vomiting and blood in stool.  Genitourinary: Negative for hematuria and decreased urine  volume.       Objective:   Physical Exam BP 110/80  Pulse 55  Temp(Src) 97 F (36.1 C) (Oral)  Ht 5\' 6"  (1.676 m)  Wt 121 lb 8 oz (55.112 kg)  BMI 19.61 kg/m2  SpO2 95%  LMP 10/24/2001 Physical Exam  VS noted, mild ill Constitutional: Pt appears well-developed and well-nourished.  HENT: Head: Normocephalic.  Right Ear: External ear normal.  Left Ear: External ear normal.  Bilat tm's mild erythema.  Sinus nontender.  Pharynx mild erythema Eyes: Conjunctivae and EOM are normal. Pupils are equal, round, and reactive to light.  Neck: Normal range of motion. Neck supple.  Cardiovascular: Normal rate and regular rhythm.   Pulmonary/Chest:  Effort normal and breath sounds normal.  Neurological: Pt is alert. No cranial nerve deficit.  Skin: Skin is warm. No erythema.  Psychiatric: Pt behavior is normal. Thought content normal. 2+ nervous     Assessment & Plan:

## 2011-12-14 NOTE — Assessment & Plan Note (Signed)
BP Readings from Last 3 Encounters:  12/14/11 110/80  11/07/11 128/80  11/03/11 110/84   stable overall by hx and exam, most recent data reviewed with pt, and pt to continue medical treatment as before

## 2011-12-14 NOTE — Assessment & Plan Note (Signed)
Mild to mod, for antibx course,  to f/u any worsening symptoms or concerns 

## 2012-04-05 ENCOUNTER — Other Ambulatory Visit: Payer: Self-pay | Admitting: Internal Medicine

## 2012-04-11 ENCOUNTER — Other Ambulatory Visit: Payer: Self-pay | Admitting: Internal Medicine

## 2012-08-13 ENCOUNTER — Encounter: Payer: Self-pay | Admitting: Internal Medicine

## 2012-08-13 ENCOUNTER — Ambulatory Visit (INDEPENDENT_AMBULATORY_CARE_PROVIDER_SITE_OTHER): Payer: Managed Care, Other (non HMO) | Admitting: Internal Medicine

## 2012-08-13 VITALS — BP 120/80 | HR 54 | Temp 97.4°F | Ht 66.0 in | Wt 127.2 lb

## 2012-08-13 DIAGNOSIS — H6120 Impacted cerumen, unspecified ear: Secondary | ICD-10-CM

## 2012-08-13 DIAGNOSIS — E559 Vitamin D deficiency, unspecified: Secondary | ICD-10-CM | POA: Insufficient documentation

## 2012-08-13 DIAGNOSIS — Z0000 Encounter for general adult medical examination without abnormal findings: Secondary | ICD-10-CM

## 2012-08-13 DIAGNOSIS — I10 Essential (primary) hypertension: Secondary | ICD-10-CM

## 2012-08-13 DIAGNOSIS — F17200 Nicotine dependence, unspecified, uncomplicated: Secondary | ICD-10-CM

## 2012-08-13 DIAGNOSIS — E785 Hyperlipidemia, unspecified: Secondary | ICD-10-CM

## 2012-08-13 DIAGNOSIS — J019 Acute sinusitis, unspecified: Secondary | ICD-10-CM

## 2012-08-13 MED ORDER — LEVOFLOXACIN 250 MG PO TABS: 250.0000 mg | ORAL_TABLET | Freq: Every day | ORAL | Status: DC

## 2012-08-13 NOTE — Assessment & Plan Note (Signed)
Encouraged to quit, she may consider ecigs

## 2012-08-13 NOTE — Progress Notes (Signed)
Subjective:    Patient ID: Jennifer Andrade, female    DOB: 03-17-1951, 61 y.o.   MRN: [id number]  HPI    Here with 3 days acute onset fever, facial pain, pressure, general weakness and malaise, and greenish d/c, with slight ST, but little to no cough and Pt denies chest pain, increased sob or doe, wheezing, orthopnea, PND, increased LE swelling, palpitations, dizziness or syncope.   Pt denies polydipsia, polyuria, Pt denies new neurological symptoms such as new headache, or facial or extremity weakness or numbness  Does have some mild decresed hearing loss left ear, wax impacted, was unable to be irrigated here previously, asks for ENT referral .  Has been trying to follow lower chol diet.  Still smoking, thinking of maybe quitting smoking but no plan.   Pt denies fever, wt loss, night sweats, loss of appetite, or other constitutional symptoms except for the above.  Had recent low vit D per GYN, but did not take the Vit D high dose as she was unsure of the dosing. Past Medical History  Diagnosis Date  . HYPOTHYROIDISM 04/15/2008  . HYPERLIPIDEMIA 04/15/2008  . ANEMIA-IRON DEFICIENCY 04/15/2008  . ANXIETY 04/15/2008  . Other specified forms of hearing loss 09/30/2010  . HYPERTENSION 04/15/2008  . SINUSITIS- ACUTE-NOS 09/30/2010  . RENAL INSUFFICIENCY 03/11/2009  . LUMBAR RADICULOPATHY, LEFT 11/24/2009  . OSTEOPENIA 04/15/2008  . Migraine 03/25/2011  . Cataract   . Vitamin D deficiency 10/2011    level 16   Past Surgical History  Procedure Date  . Tubal ligation   . Cataract surg     lens replacement Bil    reports that she has been smoking Cigarettes.  She has been smoking about 1 pack per day. She has never used smokeless tobacco. She reports that she does not drink alcohol or use illicit drugs. family history includes Alcohol abuse in her other; Arthritis in her other; Cancer in her other; Diabetes in her other; Heart disease in her father and other; Hypertension in her father and other; Kidney  disease in her father; and Stroke in her other. Allergies  Allergen Reactions  . Atorvastatin     REACTION: myalgias  . Rosuvastatin     REACTION: memory problem   Current Outpatient Prescriptions on File Prior to Visit  Medication Sig Dispense Refill  . amLODipine (NORVASC) 5 MG tablet TAKE 1 TABLET DAILY  90 tablet  1  . aspirin 81 MG EC tablet Take 81 mg by mouth daily.        . clobetasol cream (TEMOVATE) 0.05 % Apply topically 2 (two) times daily.  30 g  1  . colesevelam (WELCHOL) 625 MG tablet Take 3 tablets (1,875 mg total) by mouth 2 (two) times daily with a meal.  540 tablet  3  . hydrocortisone 2.5 % cream Apply topically 2 (two) times daily.  30 g  1  . levothyroxine (SYNTHROID, LEVOTHROID) 50 MCG tablet TAKE 1 TABLET DAILY  90 tablet  2  . propranolol ER (INDERAL LA) 60 MG 24 hr capsule TAKE 1 CAPSULE DAILY  90 capsule  1  . Vitamin D, Ergocalciferol, (DRISDOL) 50000 UNITS CAPS Take 1 capsule (50,000 Units total) by mouth every 7 (seven) days.  12 capsule  0  . ZETIA 10 MG tablet TAKE 1 TABLET DAILY  90 tablet  1   Review of Systems  Constitutional: Negative for diaphoresis and unexpected weight change.  HENT: Negative for tinnitus.   Eyes: Negative for photophobia and  visual disturbance.  Respiratory: Negative for choking and stridor.   Gastrointestinal: Negative for vomiting and blood in stool.  Genitourinary: Negative for hematuria and decreased urine volume.  Musculoskeletal: Negative for gait problem.  Skin: Negative for color change and wound.  Neurological: Negative for tremors and numbness.  Psychiatric/Behavioral: Negative for decreased concentration. The patient is not hyperactive.       Objective:   Physical Exam BP 120/80  Pulse 54  Temp 97.4 F (36.3 C) (Oral)  Ht 5\' 6"  (1.676 m)  Wt 127 lb 4 oz (57.72 kg)  BMI 20.54 kg/m2  SpO2 92%  LMP 10/24/2001 Physical Exam  VS noted, mild tender Constitutional: Pt appears well-developed and well-nourished.    HENT: Head: Normocephalic.  Right Ear: External ear normal.  Left Ear: External ear normal.  Left TM/canal with wax impaction Bilat tm's mild erythema.  Sinus tender.  Pharynx mild erythema Eyes: Conjunctivae and EOM are normal. Pupils are equal, round, and reactive to light.  Neck: Normal range of motion. Neck supple.  Cardiovascular: Normal rate and regular rhythm.   Pulmonary/Chest: Effort normal and breath sounds normal.  Neurological: Pt is alert. Not confused  Skin: Skin is warm. No erythema.  Psychiatric: Pt behavior is normal. Thought content normal.     Assessment & Plan:

## 2012-08-13 NOTE — Assessment & Plan Note (Signed)
Ok for otc vit d 1000 units per day

## 2012-08-13 NOTE — Assessment & Plan Note (Signed)
stable overall by hx and exam, most recent data reviewed with pt, and pt to continue medical treatment as before BP Readings from Last 3 Encounters:  08/13/12 120/80  12/14/11 110/80  11/07/11 128/80

## 2012-08-13 NOTE — Assessment & Plan Note (Signed)
Has apparent persistent elev chol with prob genetic component, intol of statin, cont lower chol diet

## 2012-08-13 NOTE — Patient Instructions (Addendum)
Take all new medications as prescribed Continue all other medications as before You will be contacted regarding the referral for: ENT Please continue your efforts at being more active, low cholesterol diet, and weight control. Please quit smoking Please start Vit D OTC - 1000 units per day Please return in 3 mo with Lab testing done 3-5 days before

## 2012-08-13 NOTE — Assessment & Plan Note (Signed)
For ent referral 

## 2012-09-15 ENCOUNTER — Other Ambulatory Visit: Payer: Self-pay | Admitting: Internal Medicine

## 2012-10-30 ENCOUNTER — Encounter: Payer: Managed Care, Other (non HMO) | Admitting: Internal Medicine

## 2012-10-30 DIAGNOSIS — Z0289 Encounter for other administrative examinations: Secondary | ICD-10-CM

## 2012-12-08 ENCOUNTER — Other Ambulatory Visit: Payer: Self-pay | Admitting: Internal Medicine

## 2013-02-20 ENCOUNTER — Ambulatory Visit (INDEPENDENT_AMBULATORY_CARE_PROVIDER_SITE_OTHER): Payer: BC Managed Care – PPO | Admitting: Internal Medicine

## 2013-02-20 ENCOUNTER — Encounter: Payer: Self-pay | Admitting: Internal Medicine

## 2013-02-20 VITALS — BP 130/80 | HR 80 | Temp 98.1°F | Wt 121.8 lb

## 2013-02-20 DIAGNOSIS — I10 Essential (primary) hypertension: Secondary | ICD-10-CM

## 2013-02-20 DIAGNOSIS — M899 Disorder of bone, unspecified: Secondary | ICD-10-CM

## 2013-02-20 DIAGNOSIS — G43009 Migraine without aura, not intractable, without status migrainosus: Secondary | ICD-10-CM

## 2013-02-20 DIAGNOSIS — Z0000 Encounter for general adult medical examination without abnormal findings: Secondary | ICD-10-CM

## 2013-02-20 DIAGNOSIS — M949 Disorder of cartilage, unspecified: Secondary | ICD-10-CM

## 2013-02-20 DIAGNOSIS — E559 Vitamin D deficiency, unspecified: Secondary | ICD-10-CM

## 2013-02-20 MED ORDER — AMLODIPINE BESYLATE 5 MG PO TABS: ORAL_TABLET | ORAL | Status: DC

## 2013-02-20 MED ORDER — LEVOTHYROXINE SODIUM 50 MCG PO TABS: ORAL_TABLET | ORAL | Status: DC

## 2013-02-20 MED ORDER — PROPRANOLOL HCL ER 60 MG PO CP24: ORAL_CAPSULE | ORAL | Status: DC

## 2013-02-20 MED ORDER — EZETIMIBE 10 MG PO TABS: ORAL_TABLET | ORAL | Status: DC

## 2013-02-20 MED ORDER — VITAMIN D (ERGOCALCIFEROL) 1.25 MG (50000 UNIT) PO CAPS: 50000.0000 [IU] | ORAL_CAPSULE | ORAL | Status: DC

## 2013-02-20 NOTE — Assessment & Plan Note (Signed)
D/w pt, reassured, ok to take the 50K unit vit d rx, will re-send

## 2013-02-20 NOTE — Assessment & Plan Note (Signed)
It is verified she does have 2 different (but similar) pills in her last 90 day refill, could be a benign explanation such as a mixture of amlodipine 5 mg pills from different generic manufacturers, urged pt to d/w a local pharmacist who can identify the pills for her; gave new rx for amlod 5 mg to be filled locally

## 2013-02-20 NOTE — Progress Notes (Signed)
Subjective:    Patient ID: Jennifer Andrade, female    DOB: 1950/12/01, 62 y.o.   MRN: [id number]  HPI   Here to f/u, brings with her a bottle labled amlodipine 5 mg from her mailin pharmacy, but clearly has 2 different types of pills (similar octagon shape, but different sizes and markings), since she noticed this she stopped taking it all x 3 days;  Pt denies chest pain, increased sob or doe, wheezing, orthopnea, PND, increased LE swelling, palpitations, dizziness or syncope. Pt denies new neurological symptoms such as new facial or extremity weakness or numbness; has had increased freq of migraines and has appt with Dr Leane Para tomorrow, and coincidently with stopping the ? Amlodipine 5 mg pills has had no HA for 3 days.  Did not take the 50K units Vit d per GYN as she was afraid of the high number and did not d/w GYN.  Was rec'd to pt to consider bisphosphonate but decided no, and still of the same mind today. Past Medical History  Diagnosis Date  . HYPOTHYROIDISM 04/15/2008  . HYPERLIPIDEMIA 04/15/2008  . ANEMIA-IRON DEFICIENCY 04/15/2008  . ANXIETY 04/15/2008  . Other specified forms of hearing loss 09/30/2010  . HYPERTENSION 04/15/2008  . SINUSITIS- ACUTE-NOS 09/30/2010  . RENAL INSUFFICIENCY 03/11/2009  . LUMBAR RADICULOPATHY, LEFT 11/24/2009  . OSTEOPENIA 04/15/2008  . Migraine 03/25/2011  . Cataract   . Vitamin D deficiency 10/2011    level 16   Past Surgical History  Procedure Laterality Date  . Tubal ligation    . Cataract surg      lens replacement Bil    reports that she has been smoking Cigarettes.  She has been smoking about 1.00 pack per day. She has never used smokeless tobacco. She reports that she does not drink alcohol or use illicit drugs. family history includes Alcohol abuse in her other; Arthritis in her other; Cancer in her other; Diabetes in her other; Heart disease in her father and other; Hypertension in her father and other; Kidney disease in her father; and Stroke in  her other. Allergies  Allergen Reactions  . Atorvastatin     REACTION: myalgias  . Rosuvastatin     REACTION: memory problem   Current Outpatient Prescriptions on File Prior to Visit  Medication Sig Dispense Refill  . aspirin 81 MG EC tablet Take 81 mg by mouth daily.        . colesevelam (WELCHOL) 625 MG tablet Take 3 tablets (1,875 mg total) by mouth 2 (two) times daily with a meal.  540 tablet  3   No current facility-administered medications on file prior to visit.   Review of Systems  Constitutional: Negative for unexpected weight change, or unusual diaphoresis  HENT: Negative for tinnitus.   Eyes: Negative for photophobia and visual disturbance.  Respiratory: Negative for choking and stridor.   Gastrointestinal: Negative for vomiting and blood in stool.  Genitourinary: Negative for hematuria and decreased urine volume.  Musculoskeletal: Negative for acute joint swelling Skin: Negative for color change and wound.  Neurological: Negative for tremors and numbness other than noted  Psychiatric/Behavioral: Negative for decreased concentration or  hyperactivity.       Objective:   Physical Exam BP 130/80  Pulse 80  Temp(Src) 98.1 F (36.7 C) (Oral)  Wt 121 lb 12.8 oz (55.248 kg)  BMI 19.67 kg/m2  SpO2 95%  LMP 10/24/2001 VS noted,  Constitutional: Pt appears well-developed and well-nourished.  HENT: Head: NCAT.  Right Ear: External ear  normal.  Left Ear: External ear normal.  Eyes: Conjunctivae and EOM are normal. Pupils are equal, round, and reactive to light.  Neck: Normal range of motion. Neck supple.  Cardiovascular: Normal rate and regular rhythm.   Pulmonary/Chest: Effort normal and breath sounds normal.  Neurological: Pt is alert. Not confused , motor intact Skin: Skin is warm. No erythema. No LE edema Psychiatric: Pt behavior is normal. Thought content normal.1+ nervous     Assessment & Plan:

## 2013-02-20 NOTE — Assessment & Plan Note (Signed)
Coincidently none for 3 days, has appt with neurology tomorrow, d/w pt - doubt relates to her amlodipine but cant be sure given the pill discrepancy,  to f/u any worsening symptoms or concerns

## 2013-02-20 NOTE — Patient Instructions (Addendum)
You are given the new prescription for 30 days on the Amlodipine Please ask your local pharmacist about the discrepancy in the amloidipine pills from [facility] Please continue all other medications as before, and refills have been done if requested. We will send all refills to [facility] for you later today (90 days) Please keep your appointments with your specialists as you have planned - Dr Clarisse Gouge Please take all new medication as prescribed - the Vit D as discussed  Thank you for enrolling in MyChart. Please follow the instructions below to securely access your online medical record. MyChart allows you to send messages to your doctor, view your test results, renew your prescriptions, schedule appointments, and more. To Log into My Chart online, please go by [employer] or [employer] to [email or web address], or download the MyChart App from the [employer] of [employer] .  Your Username is: [email or web address] (pass thor) Please return in 3 months, or sooner if needed, with Lab testing done 3-5 days before

## 2013-02-20 NOTE — Assessment & Plan Note (Signed)
D/w pt as per GYN, still declines bisphosphonate trial

## 2013-04-01 ENCOUNTER — Other Ambulatory Visit: Payer: Self-pay | Admitting: Neurology

## 2013-04-01 DIAGNOSIS — R0989 Other specified symptoms and signs involving the circulatory and respiratory systems: Secondary | ICD-10-CM

## 2013-04-02 ENCOUNTER — Encounter: Payer: Self-pay | Admitting: Internal Medicine

## 2013-04-02 ENCOUNTER — Ambulatory Visit (INDEPENDENT_AMBULATORY_CARE_PROVIDER_SITE_OTHER): Payer: BC Managed Care – PPO | Admitting: Internal Medicine

## 2013-04-02 VITALS — BP 130/84 | HR 72 | Temp 97.2°F | Ht 66.0 in | Wt 121.5 lb

## 2013-04-02 DIAGNOSIS — R0989 Other specified symptoms and signs involving the circulatory and respiratory systems: Secondary | ICD-10-CM

## 2013-04-02 DIAGNOSIS — Z0000 Encounter for general adult medical examination without abnormal findings: Secondary | ICD-10-CM

## 2013-04-02 DIAGNOSIS — I10 Essential (primary) hypertension: Secondary | ICD-10-CM

## 2013-04-02 MED ORDER — IRBESARTAN 75 MG PO TABS: 75.0000 mg | ORAL_TABLET | Freq: Every day | ORAL | Status: DC

## 2013-04-02 NOTE — Progress Notes (Signed)
Subjective:    Patient ID: Jennifer Andrade, female    DOB: 06/26/1951, 62 y.o.   MRN: [id number]  HPI  Here for wellness and f/u;  Overall doing ok;  Pt denies CP, worsening SOB, DOE, wheezing, orthopnea, PND, worsening LE edema, palpitations, dizziness or syncope.  Pt denies neurological change such as new headache, facial or extremity weakness.  Pt denies polydipsia, polyuria, or low sugar symptoms. Pt states overall good compliance with treatment and medications, good tolerability, and has been trying to follow lower cholesterol diet.  Pt denies worsening depressive symptoms, suicidal ideation or panic. No fever, night sweats, wt loss, loss of appetite, or other constitutional symptoms.  Pt states good ability with ADL's, has low fall risk, home safety reviewed and adequate, no other significant changes in hearing or vision, and only occasionally active with exercise. Inderal LA stopped per neurollgy as suspected may be contributing to her pain.    Told per Dr Vela Prose about left carotid bruit, ahs appt Mon with [place] [facility] for carotid dopplers. Past Medical History  Diagnosis Date  . HYPOTHYROIDISM 04/15/2008  . HYPERLIPIDEMIA 04/15/2008  . ANEMIA-IRON DEFICIENCY 04/15/2008  . ANXIETY 04/15/2008  . Other specified forms of hearing loss 09/30/2010  . HYPERTENSION 04/15/2008  . SINUSITIS- ACUTE-NOS 09/30/2010  . RENAL INSUFFICIENCY 03/11/2009  . LUMBAR RADICULOPATHY, LEFT 11/24/2009  . OSTEOPENIA 04/15/2008  . Migraine 03/25/2011  . Cataract   . Vitamin D deficiency 10/2011    level 16   Past Surgical History  Procedure Laterality Date  . Tubal ligation    . Cataract surg      lens replacement Bil    reports that she has been smoking Cigarettes.  She has been smoking about 1.00 pack per day. She has never used smokeless tobacco. She reports that she does not drink alcohol or use illicit drugs. family history includes Alcohol abuse in her other; Arthritis in her other; Cancer in her other; Diabetes in  her other; Heart disease in her father and other; Hypertension in her father and other; Kidney disease in her father; and Stroke in her other. Allergies  Allergen Reactions  . Atorvastatin     REACTION: myalgias  . Rosuvastatin     REACTION: memory problem   Current Outpatient Prescriptions on File Prior to Visit  Medication Sig Dispense Refill  . amLODipine (NORVASC) 5 MG tablet TAKE 1 TABLET DAILY  90 tablet  3  . aspirin 81 MG EC tablet Take 81 mg by mouth daily.        Marland Kitchen ezetimibe (ZETIA) 10 MG tablet TAKE 1 TABLET DAILY  90 tablet  3  . levothyroxine (SYNTHROID, LEVOTHROID) 50 MCG tablet TAKE 1 TABLET DAILY  90 tablet  3  . Vitamin D, Ergocalciferol, (DRISDOL) 50000 UNITS CAPS Take 1 capsule (50,000 Units total) by mouth every 7 (seven) days.  12 capsule  0  . colesevelam (WELCHOL) 625 MG tablet Take 3 tablets (1,875 mg total) by mouth 2 (two) times daily with a meal.  540 tablet  3   No current facility-administered medications on file prior to visit.   Review of Systems Constitutional: Negative for diaphoresis, activity change, appetite change or unexpected weight change.  HENT: Negative for hearing loss, ear pain, facial swelling, mouth sores and neck stiffness.   Eyes: Negative for pain, redness and visual disturbance.  Respiratory: Negative for shortness of breath and wheezing.   Cardiovascular: Negative for chest pain and palpitations.  Gastrointestinal: Negative for diarrhea, blood in stool,  abdominal distention or other pain Genitourinary: Negative for hematuria, flank pain or change in urine volume.  Musculoskeletal: Negative for myalgias and joint swelling.  Skin: Negative for color change and wound.  Neurological: Negative for syncope and numbness. other than noted Hematological: Negative for adenopathy.  Psychiatric/Behavioral: Negative for hallucinations, self-injury, decreased concentration and agitation.      Objective:   Physical Exam BP 130/84  Pulse 72   Temp(Src) 97.2 F (36.2 C) (Oral)  Ht 5\' 6"  (1.676 m)  Wt 121 lb 8 oz (55.112 kg)  BMI 19.62 kg/m2  SpO2 97%  LMP 10/24/2001 VS noted,  Constitutional: Pt is oriented to person, place, and time. Appears well-developed and well-nourished.  + wheezy short midsystolic bruit  Head: Normocephalic and atraumatic.  Right Ear: External ear normal.  Left Ear: External ear normal.  Nose: Nose normal.  Mouth/Throat: Oropharynx is clear and moist.  Eyes: Conjunctivae and EOM are normal. Pupils are equal, round, and reactive to light.  Neck: Normal range of motion. Neck supple. No JVD present. No tracheal deviation present.  Cardiovascular: Normal rate, regular rhythm, normal heart sounds and intact distal pulses.   Pulmonary/Chest: Effort normal and breath sounds normal.  Abdominal: Soft. Bowel sounds are normal. There is no tenderness. No HSM  Musculoskeletal: Normal range of motion. Exhibits no edema.  Lymphadenopathy:  Has no cervical adenopathy.  Neurological: Pt is alert and oriented to person, place, and time. Pt has normal reflexes. No cranial nerve deficit.  Skin: Skin is warm and dry. No rash noted.  Psychiatric:  Has  normal mood and affect. Behavior is normal.     Assessment & Plan:

## 2013-04-02 NOTE — Patient Instructions (Addendum)
OK to stay off the propranolol ER  Please take all new medication as prescribed - the lower dose generic avapro at 75 mg per day  Please continue all other medications as before  Please have the pharmacy call with any other refills you may need.  Please stop smoking  Please plan to attend your appt for the carotid dopplers on Monday  Please go to the LAB in the Basement (turn left off the elevator) for the tests to be done tomorrow  You will be contacted by phone if any changes need to be made immediately.  Otherwise, you will receive a letter about your results with an explanation, but please check with MyChart first.  Please remember to sign up for My Chart if you have not done so, as this will be important to you in the future with finding out test results, communicating by private email, and scheduling acute appointments online when needed.  Please return in 6 months, or sooner if needed

## 2013-04-03 NOTE — Assessment & Plan Note (Signed)
stable overall by history and exam, recent data reviewed with pt, and pt to continue off the beta blocker, to start avapro 75 qd, to f/u any worsening symptoms or concerns

## 2013-04-03 NOTE — Assessment & Plan Note (Signed)

## 2013-04-03 NOTE — Assessment & Plan Note (Signed)
I agree with assessment and need for carotid dopplers, pt plans to attend dopplers appt

## 2013-04-08 ENCOUNTER — Ambulatory Visit
Admission: RE | Admit: 2013-04-08 | Discharge: 2013-04-08 | Disposition: A | Discharge status: Home / Self Care | Payer: BC Managed Care – PPO | Source: Ambulatory Visit | Attending: Neurology | Admitting: Neurology

## 2013-04-08 DIAGNOSIS — R0989 Other specified symptoms and signs involving the circulatory and respiratory systems: Secondary | ICD-10-CM

## 2013-05-02 ENCOUNTER — Other Ambulatory Visit: Payer: Self-pay

## 2013-05-02 MED ORDER — IRBESARTAN 75 MG PO TABS: 75.0000 mg | ORAL_TABLET | Freq: Every day | ORAL | Status: DC

## 2013-05-22 ENCOUNTER — Other Ambulatory Visit: Payer: Self-pay | Admitting: Neurology

## 2013-05-22 DIAGNOSIS — H539 Unspecified visual disturbance: Secondary | ICD-10-CM

## 2013-07-30 ENCOUNTER — Telehealth: Payer: Self-pay

## 2013-07-30 MED ORDER — EPINEPHRINE 0.3 MG/0.3ML IJ SOAJ: 0.3000 mg | Freq: Once | INTRAMUSCULAR | Status: DC

## 2013-07-30 NOTE — Telephone Encounter (Signed)
Ok for [employer], sent to [facility]

## 2013-07-30 NOTE — Telephone Encounter (Signed)
Patient informed. 

## 2013-07-30 NOTE — Telephone Encounter (Signed)
The patient is going to the [place] for a vacation.  She stated her last trip there she was bitten by an insect and did not do well.  Her pharmacist has suggested MD providing her with an epipen rx to [facility] [address]

## 2013-08-29 ENCOUNTER — Other Ambulatory Visit: Payer: Self-pay

## 2013-10-02 ENCOUNTER — Ambulatory Visit: Payer: BC Managed Care – PPO | Admitting: Internal Medicine

## 2013-10-30 ENCOUNTER — Ambulatory Visit (INDEPENDENT_AMBULATORY_CARE_PROVIDER_SITE_OTHER): Payer: BC Managed Care – PPO | Admitting: Internal Medicine

## 2013-10-30 DIAGNOSIS — Z0289 Encounter for other administrative examinations: Secondary | ICD-10-CM

## 2013-10-30 DIAGNOSIS — I10 Essential (primary) hypertension: Secondary | ICD-10-CM

## 2013-10-30 NOTE — Progress Notes (Signed)
Pre-visit discussion using our clinic review tool. No additional management support is needed unless otherwise documented below in the visit note.  

## 2013-10-31 NOTE — Progress Notes (Signed)
Note in error.

## 2013-12-19 ENCOUNTER — Other Ambulatory Visit: Payer: Self-pay | Admitting: Internal Medicine

## 2014-01-04 ENCOUNTER — Other Ambulatory Visit: Payer: Self-pay | Admitting: Internal Medicine

## 2014-03-19 ENCOUNTER — Other Ambulatory Visit: Payer: Self-pay | Admitting: Internal Medicine

## 2014-03-31 ENCOUNTER — Other Ambulatory Visit: Payer: Self-pay | Admitting: Internal Medicine

## 2014-08-25 ENCOUNTER — Encounter: Payer: Self-pay | Admitting: Internal Medicine

## 2014-10-07 ENCOUNTER — Encounter: Payer: Self-pay | Admitting: Gynecology

## 2014-11-21 ENCOUNTER — Other Ambulatory Visit: Payer: Self-pay | Admitting: Internal Medicine

## 2014-11-26 ENCOUNTER — Encounter: Payer: Self-pay | Admitting: Gynecology

## 2014-12-04 ENCOUNTER — Other Ambulatory Visit: Payer: Self-pay | Admitting: Internal Medicine

## 2014-12-25 ENCOUNTER — Encounter: Payer: Self-pay | Admitting: Gynecology

## 2014-12-25 ENCOUNTER — Ambulatory Visit (INDEPENDENT_AMBULATORY_CARE_PROVIDER_SITE_OTHER): Payer: No Typology Code available for payment source | Admitting: Gynecology

## 2014-12-25 ENCOUNTER — Other Ambulatory Visit (HOSPITAL_COMMUNITY)
Admission: RE | Admit: 2014-12-25 | Discharge: 2014-12-25 | Disposition: A | Discharge status: Home / Self Care | Payer: No Typology Code available for payment source | Source: Ambulatory Visit | Attending: Gynecology | Admitting: Gynecology

## 2014-12-25 VITALS — BP 120/80 | Ht 66.0 in | Wt 126.0 lb

## 2014-12-25 DIAGNOSIS — M8580 Other specified disorders of bone density and structure, unspecified site: Secondary | ICD-10-CM

## 2014-12-25 DIAGNOSIS — Z1151 Encounter for screening for human papillomavirus (HPV): Secondary | ICD-10-CM | POA: Diagnosis present

## 2014-12-25 DIAGNOSIS — Z01419 Encounter for gynecological examination (general) (routine) without abnormal findings: Secondary | ICD-10-CM | POA: Diagnosis not present

## 2014-12-25 LAB — COMPREHENSIVE METABOLIC PANEL
ALK PHOS: 59 U/L (ref 39–117)
ALT: 11 U/L (ref 0–35)
AST: 15 U/L (ref 0–37)
Albumin: 4.9 g/dL (ref 3.5–5.2)
BILIRUBIN TOTAL: 0.5 mg/dL (ref 0.2–1.2)
BUN: 24 mg/dL — ABNORMAL HIGH (ref 6–23)
CO2: 27 meq/L (ref 19–32)
CREATININE: 1.08 mg/dL (ref 0.50–1.10)
Calcium: 10.5 mg/dL (ref 8.4–10.5)
Chloride: 102 mEq/L (ref 96–112)
GLUCOSE: 101 mg/dL — AB (ref 70–99)
Potassium: 5.3 mEq/L (ref 3.5–5.3)
Sodium: 140 mEq/L (ref 135–145)
TOTAL PROTEIN: 7.9 g/dL (ref 6.0–8.3)

## 2014-12-25 LAB — CBC WITH DIFFERENTIAL/PLATELET
BASOS PCT: 0 % (ref 0–1)
Basophils Absolute: 0 10*3/uL (ref 0.0–0.1)
EOS PCT: 1 % (ref 0–5)
Eosinophils Absolute: 0.1 10*3/uL (ref 0.0–0.7)
HEMATOCRIT: 45.3 % (ref 36.0–46.0)
Hemoglobin: 15.1 g/dL — ABNORMAL HIGH (ref 12.0–15.0)
Lymphocytes Relative: 31 % (ref 12–46)
Lymphs Abs: 1.9 10*3/uL (ref 0.7–4.0)
MCH: 30.9 pg (ref 26.0–34.0)
MCHC: 33.3 g/dL (ref 30.0–36.0)
MCV: 92.8 fL (ref 78.0–100.0)
MONO ABS: 0.6 10*3/uL (ref 0.1–1.0)
MPV: 11.7 fL (ref 8.6–12.4)
Monocytes Relative: 10 % (ref 3–12)
Neutro Abs: 3.6 10*3/uL (ref 1.7–7.7)
Neutrophils Relative %: 58 % (ref 43–77)
Platelets: 241 10*3/uL (ref 150–400)
RBC: 4.88 MIL/uL (ref 3.87–5.11)
RDW: 14.6 % (ref 11.5–15.5)
WBC: 6.2 10*3/uL (ref 4.0–10.5)

## 2014-12-25 LAB — LIPID PANEL
CHOLESTEROL: 279 mg/dL — AB (ref 0–200)
HDL: 52 mg/dL (ref >=46)
LDL CALC: 200 mg/dL — AB (ref 0–99)
Total CHOL/HDL Ratio: 5.4 Ratio
Triglycerides: 133 mg/dL (ref <150)
VLDL: 27 mg/dL (ref 0–40)

## 2014-12-25 LAB — TSH: TSH: 6.29 u[IU]/mL — AB (ref 0.350–4.500)

## 2014-12-25 MED ORDER — HYDROCORTISONE 2.5 % EX CREA: TOPICAL_CREAM | Freq: Two times a day (BID) | CUTANEOUS | Status: DC

## 2014-12-25 MED ORDER — CLOBETASOL PROPIONATE 0.05 % EX CREA: TOPICAL_CREAM | CUTANEOUS | Status: DC

## 2014-12-25 MED ORDER — EZETIMIBE 10 MG PO TABS: 10.0000 mg | ORAL_TABLET | Freq: Every day | ORAL | Status: DC

## 2014-12-25 NOTE — Addendum Note (Signed)
Addended by: Nelva Nay on: 12/25/2014 12:24 PM   Modules accepted: Orders

## 2014-12-25 NOTE — Patient Instructions (Signed)
Follow up for your bone density as scheduled.  Schedule colonoscopy with [facility] gastroenterology at [phone] or [facility] gastroenterology at [phone]  Call to Schedule your mammogram  Facilities in [place]: 1)  [facility], [place] Phone: [phone] 2)  [facility]. [address] [place] Phone: [phone] 3)  Dr. Isaiah Blakes at [facility] [address] Phone: [phone]     Mammogram A mammogram is an X-ray test to find changes in a woman's breast. You should get a mammogram if:  You are 11 years of age or older  You have risk factors.   Your doctor recommends that you have one.  BEFORE THE TEST  Do not schedule the test the week before your period, especially if your breasts are sore during this time.  On the day of your mammogram:  Wash your breasts and armpits well. After washing, do not put on any deodorant or talcum powder on until after your test.   Eat and drink as you usually do.   Take your medicines as usual.   If you are diabetic and take insulin, make sure you:   Eat before coming for your test.   Take your insulin as usual.   If you cannot keep your appointment, call before the appointment to cancel. Schedule another appointment.  TEST  You will need to undress from the waist up. You will put on a hospital gown.   Your breast will be put on the mammogram machine, and it will press firmly on your breast with a piece of plastic called a compression paddle. This will make your breast flatter so that the machine can X-ray all parts of your breast.   Both breasts will be X-rayed. Each breast will be X-rayed from above and from the side. An X-ray might need to be taken again if the picture is not good enough.   The mammogram will last about 15 to 30 minutes.  AFTER THE TEST Finding out the results of your test Ask when your test results will  be ready. Make sure you get your test results.  Document Released: 01/06/2009 Document Revised: 09/29/2011 Document Reviewed: 01/06/2009 [facility] Patient Information 2012 [place].

## 2014-12-25 NOTE — Progress Notes (Signed)
Jennifer Andrade Mar 16, 1951 [id number]        64 y.o.  G3P3 for annual exam.  Has not been seen for over 3 years. Several issues noted below.  Past medical history,surgical history, problem list, medications, allergies, family history and social history were all reviewed and documented as reviewed in the EPIC chart.  ROS:  Performed with pertinent positives and negatives included in the history, assessment and plan.   Additional significant findings :  none   Exam: Kim Counsellor Vitals:   12/25/14 1117  BP: 120/80  Height: 5\' 6"  (1.676 m)  Weight: 126 lb (57.153 kg)   General appearance:  Normal affect, orientation and appearance. Skin: Grossly normal HEENT: Without gross lesions.  No cervical or supraclavicular adenopathy. Thyroid normal.  Lungs:  Clear without wheezing, rales or rhonchi Cardiac: RR, without RMG Abdominal:  Soft, nontender, without masses, guarding, rebound, organomegaly or hernia Breasts:  Examined lying and sitting without masses, retractions, discharge or axillary adenopathy. Pelvic:  Ext/BUS/vagina with generalized atrophic changes  Cervix with atrophic changes. Pap/HPV  Uterus anteverted, normal size, shape and contour, midline and mobile nontender   Adnexa  Without masses or tenderness    Anus and perineum  Normal   Rectovaginal  Normal sphincter tone without palpated masses or tenderness.    Assessment/Plan:  64 y.o. G3P3 female for annual exam.   1. Postmenopausal/atrophic genital changes. Patient without significant symptoms of hot flashes, night sweats, vaginal dryness. No vaginal bleeding. Continue to monitor and report any vaginal bleeding. 2. Osteopenia.  Patient has a history of osteopenia with a -2.3 DEXA in 2007. There is a entry listing a 2009 date but no DEXA scan reported. Strongly recommended patient schedule DEXA now given her thin frame and cigarette smoking and history of vitamin D deficiency.  Recheck vitamin D today. Patient admits  to not taking extra vitamin D as previously recommended. Patient agrees to schedule DEXA. 3. Pap smear 2013. Pap smear/HPV today. No history of significant abnormal Pap smears previously. 4. Mammography many years ago. I again strongly recommended patient schedule a screening mammography. Most common cancer in women stressed to her and the benefits of early detection. Patient acknowledges my recommendations. SBE monthly reviewed. 5. Colonoscopy never. I again strongly recommended patient schedule a screening colonoscopy. Second most common cancer in women. Benefits of precancerous polyp removal and early cancer diagnoses reviewed. Patient acknowledges my recommendations. 6. Stop smoking again strongly recommended. Strategies reviewed with her. Patient states that she will attempt to cut down and ultimately stop. 7. Health maintenance. Patient is followed by Dr. Jenny Reichmann for hypertension hypothyroidism and hypercholesterolemia. She asked about could refill her Zetia medication for her cholesterol. She also asked if I do her baseline labs today. I refilled her is that he at 1 year but recommended patient follow up with Dr. Jenny Reichmann for her other prescriptions that she does need to be monitored by him for this. And if he feels changing her cholesterol medications indicated he will do this also. Patient agrees to schedule an appointment to see him next month. Baseline CBC comprehensive metabolic panel lipid profile urinalysis TSH and vitamin D ordered.     Anastasio Auerbach MD, 11:49 AM 12/25/2014

## 2014-12-26 LAB — URINALYSIS W MICROSCOPIC + REFLEX CULTURE
Casts: NONE SEEN
Crystals: NONE SEEN
Glucose, UA: NEGATIVE mg/dL
Ketones, ur: NEGATIVE mg/dL
Nitrite: NEGATIVE
PROTEIN: NEGATIVE mg/dL
Specific Gravity, Urine: 1.028 (ref 1.005–1.030)
Urobilinogen, UA: 0.2 mg/dL (ref 0.0–1.0)
pH: 5 (ref 5.0–8.0)

## 2014-12-26 LAB — VITAMIN D 25 HYDROXY (VIT D DEFICIENCY, FRACTURES): Vit D, 25-Hydroxy: 11 ng/mL — ABNORMAL LOW (ref 30–100)

## 2014-12-28 LAB — URINE CULTURE

## 2014-12-29 ENCOUNTER — Other Ambulatory Visit: Payer: Self-pay | Admitting: Gynecology

## 2014-12-29 DIAGNOSIS — E559 Vitamin D deficiency, unspecified: Secondary | ICD-10-CM

## 2014-12-29 LAB — CYTOLOGY - PAP

## 2014-12-29 MED ORDER — SULFAMETHOXAZOLE-TRIMETHOPRIM 800-160 MG PO TABS: 1.0000 | ORAL_TABLET | Freq: Two times a day (BID) | ORAL | Status: DC

## 2015-01-01 ENCOUNTER — Ambulatory Visit (INDEPENDENT_AMBULATORY_CARE_PROVIDER_SITE_OTHER): Payer: No Typology Code available for payment source | Admitting: Internal Medicine

## 2015-01-01 VITALS — BP 118/78 | HR 85 | Temp 98.4°F | Resp 18 | Ht 66.0 in | Wt 125.1 lb

## 2015-01-01 DIAGNOSIS — E039 Hypothyroidism, unspecified: Secondary | ICD-10-CM

## 2015-01-01 DIAGNOSIS — Z0000 Encounter for general adult medical examination without abnormal findings: Secondary | ICD-10-CM

## 2015-01-01 DIAGNOSIS — I10 Essential (primary) hypertension: Secondary | ICD-10-CM

## 2015-01-01 DIAGNOSIS — Z720 Tobacco use: Secondary | ICD-10-CM

## 2015-01-01 DIAGNOSIS — N3000 Acute cystitis without hematuria: Secondary | ICD-10-CM

## 2015-01-01 DIAGNOSIS — N390 Urinary tract infection, site not specified: Secondary | ICD-10-CM | POA: Insufficient documentation

## 2015-01-01 DIAGNOSIS — E559 Vitamin D deficiency, unspecified: Secondary | ICD-10-CM

## 2015-01-01 DIAGNOSIS — F17200 Nicotine dependence, unspecified, uncomplicated: Secondary | ICD-10-CM

## 2015-01-01 MED ORDER — AMLODIPINE BESYLATE 5 MG PO TABS: ORAL_TABLET | ORAL | Status: DC

## 2015-01-01 MED ORDER — LOSARTAN POTASSIUM 50 MG PO TABS: 50.0000 mg | ORAL_TABLET | Freq: Every day | ORAL | Status: DC

## 2015-01-01 MED ORDER — AMOXICILLIN 500 MG PO CAPS: 1000.0000 mg | ORAL_CAPSULE | Freq: Two times a day (BID) | ORAL | Status: DC

## 2015-01-01 NOTE — Assessment & Plan Note (Signed)
For change septra to amoxil asd,  to f/u any worsening symptoms or concerns

## 2015-01-01 NOTE — Assessment & Plan Note (Signed)
For Vit d 2000 units per day, declines rx for 50K per wk, f/u lab next visti

## 2015-01-01 NOTE — Patient Instructions (Addendum)
Please take all new medication as prescribed - the amoxicillin  OK to stop the irbesartan when you run out of the current bottel  Please take all new medication as prescribed - the Losartan 100 mg only After the irbesartan is finished  Ok to try OTC Capsiacin P for hand pain.  Please continue all other medications as before, and refills have been done if requested.  Please have the pharmacy call with any other refills you may need.  Please continue your efforts at being more active, low cholesterol diet, and weight control.  You are otherwise up to date with prevention measures today.  Please keep your appointments with your specialists as you may have planned  Please go to the LAB in the Basement (turn left off the elevator) for the tests to be done IN 3 MONTHS  You will be contacted by phone if any changes need to be made immediately.  Otherwise, you will receive a letter about your results with an explanation, but please check with MyChart first.  Please remember to sign up for MyChart if you have not done so, as this will be important to you in the future with finding out test results, communicating by private email, and scheduling acute appointments online when needed.  Please return in 1 year for your yearly visit, or sooner if needed  Please stop smoking, and please call if you change you mind about the CT scan for lung cancer screening

## 2015-01-01 NOTE — Assessment & Plan Note (Signed)
With slight elev only , asympt, cont same tx, f/u lab 3 mo

## 2015-01-01 NOTE — Assessment & Plan Note (Signed)
Ok for change irbesartan to losartan 50 qd,  to f/u any worsening symptoms or concerns

## 2015-01-01 NOTE — Assessment & Plan Note (Signed)

## 2015-01-01 NOTE — Progress Notes (Signed)
Pre visit review using our clinic review tool, if applicable. No additional management support is needed unless otherwise documented below in the visit note. 

## 2015-01-01 NOTE — Assessment & Plan Note (Signed)
Urged to quit 

## 2015-01-01 NOTE — Progress Notes (Signed)
Subjective:    Patient ID: Jennifer Andrade, female    DOB: 30-Mar-1951, 64 y.o.   MRN: [id number]  HPI  Here for wellness and f/u;  Overall doing ok;  Pt denies Chest pain, worsening SOB, DOE, wheezing, orthopnea, PND, worsening LE edema, palpitations, dizziness or syncope.  Pt denies neurological change such as new headache, facial or extremity weakness.  Pt denies polydipsia, polyuria, or low sugar symptoms. Pt states overall good compliance with treatment and medications, good tolerability, and has been trying to follow appropriate diet.  Pt denies worsening depressive symptoms, suicidal ideation or panic. No fever, night sweats, wt loss, loss of appetite, or other constitutional symptoms.  Pt states good ability with ADL's, has low fall risk, home safety reviewed and adequate, no other significant changes in hearing or vision, and only occasionally active with exercise. Irbesartan too ecxpensive.  Needs change to losartan if possible. Declines to take septra rx per GYN as her daughter had stevensjohnson syndrome, recent UA c/w possible UTI  Also with low vit D and slight elev TSH,  Has also been statin intolerant.  Past Medical History  Diagnosis Date  . HYPOTHYROIDISM 04/15/2008  . HYPERLIPIDEMIA 04/15/2008  . ANEMIA-IRON DEFICIENCY 04/15/2008  . Other specified forms of hearing loss 09/30/2010  . HYPERTENSION 04/15/2008  . SINUSITIS- ACUTE-NOS 09/30/2010  . LUMBAR RADICULOPATHY, LEFT 11/24/2009  . OSTEOPENIA 04/15/2008  . Migraine 03/25/2011  . Cataract   . Vitamin D deficiency 10/2011    level 16   Past Surgical History  Procedure Laterality Date  . Tubal ligation    . Cataract surg      lens replacement Bil  . Back surgery      reports that she has been smoking Cigarettes.  She has been smoking about 1.00 pack per day. She has never used smokeless tobacco. She reports that she drinks alcohol. She reports that she does not use illicit drugs. family history includes Heart disease in her  father; Hypertension in her father; Kidney disease in her father; Thyroid disease in her mother. Allergies  Allergen Reactions  . Atorvastatin     REACTION: myalgias  . Rosuvastatin     REACTION: memory problem   Current Outpatient Prescriptions on File Prior to Visit  Medication Sig Dispense Refill  . aspirin 81 MG EC tablet Take 81 mg by mouth daily.      . clobetasol cream (TEMOVATE) 0.05 % Apply daily as needed 30 g 1  . ezetimibe (ZETIA) 10 MG tablet Take 1 tablet (10 mg total) by mouth daily. 90 tablet 4  . hydrocortisone 2.5 % cream Apply topically 2 (two) times daily. 30 g 1  . levothyroxine (SYNTHROID, LEVOTHROID) 50 MCG tablet TAKE 1 TABLET DAILY 90 tablet 3  . sulfamethoxazole-trimethoprim (BACTRIM DS,SEPTRA DS) 800-160 MG per tablet Take 1 tablet by mouth 2 (two) times daily. (Patient not taking: Reported on 01/01/2015) 6 tablet 0   No current facility-administered medications on file prior to visit.    Review of Systems  Constitutional: Negative for unusual diaphoresis or night sweats HENT: Negative for ringing in ear or discharge Eyes: Negative for double vision or worsening visual disturbance.  Respiratory: Negative for choking and stridor.   Gastrointestinal: Negative for vomiting or other signifcant bowel change Genitourinary: Negative for hematuria or change in urine volume.  Musculoskeletal: Negative for other MSK pain or swelling Skin: Negative for color change and worsening wound.  Neurological: Negative for tremors and numbness other than noted  Psychiatric/Behavioral: Negative  for decreased concentration or agitation other than above       Objective:   Physical Exam BP 118/78 mmHg  Pulse 85  Temp(Src) 98.4 F (36.9 C) (Oral)  Resp 18  Ht 5\' 6"  (1.676 m)  Wt 125 lb 1.3 oz (56.736 kg)  BMI 20.20 kg/m2  SpO2 93%  LMP 10/24/2001 .VS noted,  Constitutional: Pt is oriented to person, place, and time. Appears well-developed and well-nourished, in no  significant distress Head: Normocephalic and atraumatic.  Right Ear: External ear normal.  Left Ear: External ear normal.  Nose: Nose normal.  Mouth/Throat: Oropharynx is clear and moist.  Eyes: Conjunctivae and EOM are normal. Pupils are equal, round, and reactive to light.  Neck: Normal range of motion. Neck supple. No JVD present. No tracheal deviation present or significant neck LA or mass Cardiovascular: Normal rate, regular rhythm, normal heart sounds and intact distal pulses.   Pulmonary/Chest: Effort normal and breath sounds without rales or wheezing  Abdominal: Soft. Bowel sounds are normal. NT. No HSM  Musculoskeletal: Normal range of motion. Exhibits no edema.  Lymphadenopathy:  Has no cervical adenopathy.  Neurological: Pt is alert and oriented to person, place, and time. Pt has normal reflexes. No cranial nerve deficit. Motor grossly intact Skin: Skin is warm and dry. No rash noted.  Psychiatric:  Has mild nervous mood and affect. Behavior is normal.      Assessment & Plan:

## 2015-01-21 ENCOUNTER — Encounter: Payer: Self-pay | Admitting: Internal Medicine

## 2015-01-21 ENCOUNTER — Ambulatory Visit (INDEPENDENT_AMBULATORY_CARE_PROVIDER_SITE_OTHER): Payer: No Typology Code available for payment source | Admitting: Internal Medicine

## 2015-01-21 ENCOUNTER — Telehealth: Payer: Self-pay | Admitting: Internal Medicine

## 2015-01-21 VITALS — BP 138/80 | HR 88 | Temp 98.2°F | Resp 18 | Ht 66.0 in | Wt 126.1 lb

## 2015-01-21 DIAGNOSIS — H9312 Tinnitus, left ear: Secondary | ICD-10-CM

## 2015-01-21 DIAGNOSIS — J309 Allergic rhinitis, unspecified: Secondary | ICD-10-CM | POA: Diagnosis not present

## 2015-01-21 DIAGNOSIS — H93A2 Pulsatile tinnitus, left ear: Secondary | ICD-10-CM

## 2015-01-21 DIAGNOSIS — I10 Essential (primary) hypertension: Secondary | ICD-10-CM | POA: Diagnosis not present

## 2015-01-21 MED ORDER — CETIRIZINE HCL 10 MG PO TABS: 10.0000 mg | ORAL_TABLET | Freq: Every day | ORAL | Status: DC

## 2015-01-21 MED ORDER — TRIAMCINOLONE ACETONIDE 55 MCG/ACT NA AERO: 2.0000 | INHALATION_SPRAY | Freq: Every day | NASAL | Status: DC

## 2015-01-21 MED ORDER — EZETIMIBE 10 MG PO TABS: 10.0000 mg | ORAL_TABLET | Freq: Every day | ORAL | Status: DC

## 2015-01-21 MED ORDER — METOPROLOL TARTRATE 50 MG PO TABS: 50.0000 mg | ORAL_TABLET | Freq: Two times a day (BID) | ORAL | Status: DC

## 2015-01-21 NOTE — Addendum Note (Signed)
Addended by: Valerie Salts on: 01/21/2015 04:33 PM   Modules accepted: Orders

## 2015-01-21 NOTE — Assessment & Plan Note (Signed)
,  mild, o/w for zyrtec/nasacot asd,  to f/u any worsening symptoms or concerns

## 2015-01-21 NOTE — Assessment & Plan Note (Signed)
ECG reviewed as per emr, not clear if having arrythmia or not, pt prefers to change the amlodipine - will change to metoprolol 50 bid, cont losartan 50 qd, cont to monitor BP, f/u next visit

## 2015-01-21 NOTE — Progress Notes (Signed)
Pre visit review using our clinic review tool, if applicable. No additional management support is needed unless otherwise documented below in the visit note. 

## 2015-01-21 NOTE — Progress Notes (Signed)
Subjective:    Patient ID: Jennifer Andrade, female    DOB: 10/03/1951, 64 y.o.   MRN: [id number]  HPI  Here with c/o hearing pulse in the left ear, not sure if fast or now, just hears every once in a while. Pt denies chest pain, increased sob or doe, wheezing, orthopnea, PND, increased LE swelling, palpitations, dizziness or syncope.  Pt denies new neurological symptoms such as new headache, or facial or extremity weakness or numbness  Does have several wks ongoing nasal allergy symptoms with clearish congestion, itch and sneezing, without fever, pain, ST, cough, swelling or wheezing.   No recent fever,  No recent MRI head.   Past Medical History  Diagnosis Date  . HYPOTHYROIDISM 04/15/2008  . HYPERLIPIDEMIA 04/15/2008  . ANEMIA-IRON DEFICIENCY 04/15/2008  . Other specified forms of hearing loss 09/30/2010  . HYPERTENSION 04/15/2008  . SINUSITIS- ACUTE-NOS 09/30/2010  . LUMBAR RADICULOPATHY, LEFT 11/24/2009  . OSTEOPENIA 04/15/2008  . Migraine 03/25/2011  . Cataract   . Vitamin D deficiency 10/2011    level 16   Past Surgical History  Procedure Laterality Date  . Tubal ligation    . Cataract surg      lens replacement Bil  . Back surgery      reports that she has been smoking Cigarettes.  She has been smoking about 1.00 pack per day. She has never used smokeless tobacco. She reports that she drinks alcohol. She reports that she does not use illicit drugs. family history includes Heart disease in her father; Hypertension in her father; Kidney disease in her father; Thyroid disease in her mother. Allergies  Allergen Reactions  . Atorvastatin     REACTION: myalgias  . Rosuvastatin     REACTION: memory problem   Current Outpatient Prescriptions on File Prior to Visit  Medication Sig Dispense Refill  . amLODipine (NORVASC) 5 MG tablet TAKE 1 TABLET DAILY 90 tablet 3  . amoxicillin (AMOXIL) 500 MG capsule Take 2 capsules (1,000 mg total) by mouth 2 (two) times daily. 40 capsule 0  . aspirin  81 MG EC tablet Take 81 mg by mouth daily.      . clobetasol cream (TEMOVATE) 0.05 % Apply daily as needed 30 g 1  . hydrocortisone 2.5 % cream Apply topically 2 (two) times daily. 30 g 1  . levothyroxine (SYNTHROID, LEVOTHROID) 50 MCG tablet TAKE 1 TABLET DAILY 90 tablet 3  . losartan (COZAAR) 50 MG tablet Take 1 tablet (50 mg total) by mouth daily. 90 tablet 3  . sulfamethoxazole-trimethoprim (BACTRIM DS,SEPTRA DS) 800-160 MG per tablet Take 1 tablet by mouth 2 (two) times daily. 6 tablet 0   No current facility-administered medications on file prior to visit.   Review of Systems  Constitutional: Negative for unusual diaphoresis or night sweats HENT: Negative for ringing in ear or discharge Eyes: Negative for double vision or worsening visual disturbance.  Respiratory: Negative for choking and stridor.   Gastrointestinal: Negative for vomiting or other signifcant bowel change Genitourinary: Negative for hematuria or change in urine volume.  Musculoskeletal: Negative for other MSK pain or swelling Skin: Negative for color change and worsening wound.  Neurological: Negative for tremors and numbness other than noted  Psychiatric/Behavioral: Negative for decreased concentration or agitation other than above       Objective:   Physical Exam BP 138/80 mmHg  Pulse 88  Temp(Src) 98.2 F (36.8 C) (Oral)  Resp 18  Ht 5\' 6"  (1.676 m)  Wt 126 lb  1.3 oz (57.19 kg)  BMI 20.36 kg/m2  SpO2 93%  LMP 10/24/2001 VS noted,  Constitutional: Pt appears in no significant distress HENT: Head: NCAT.  Right Ear: External ear normal.  Left Ear: External ear normal.  Bilat tm's with mild erythema.  Max sinus areas non tender.  Pharynx with mild erythema, no exudate Eyes: . Pupils are equal, round, and reactive to light. Conjunctivae and EOM are normal Neck: Normal range of motion. Neck supple.  Cardiovascular: Normal rate and regular rhythm.   Pulmonary/Chest: Effort normal and breath sounds without  rales or wheezing.  Abd:  Soft, NT, ND, + BS Neurological: Pt is alert. Not confused , motor grossly intact, sens/dtr/gait intatt Skin: Skin is warm. No rash, no LE edema Psychiatric: Pt behavior is normal. No agitation.     Assessment & Plan:

## 2015-01-21 NOTE — Assessment & Plan Note (Signed)
supect eustach valve dysfn, for allergy tx, consider MRI if not improved, for zyrtec/flonase, consider mucinex bid prn as well

## 2015-01-21 NOTE — Telephone Encounter (Signed)
emmi emailed °

## 2015-01-21 NOTE — Patient Instructions (Signed)
Your EKG was OK today  Please take all new medication as prescribed - the metoprolol 50 mg twice per day  OK to stop the amlodipine 5 mg per day  Please take all new medication as prescribed- the zyrtec and nasacort for allergies  Please continue all other medications as before, and refills have been done if requested.  Please have the pharmacy call with any other refills you may need.  Please keep your appointments with your specialists as you may have planned

## 2015-01-23 ENCOUNTER — Other Ambulatory Visit: Payer: Self-pay | Admitting: Internal Medicine

## 2015-01-23 ENCOUNTER — Other Ambulatory Visit: Payer: Self-pay

## 2015-01-23 MED ORDER — TRIAMCINOLONE ACETONIDE 55 MCG/ACT NA AERO: 2.0000 | INHALATION_SPRAY | Freq: Every day | NASAL | Status: DC

## 2015-01-23 MED ORDER — METOPROLOL TARTRATE 50 MG PO TABS: 50.0000 mg | ORAL_TABLET | Freq: Two times a day (BID) | ORAL | Status: DC

## 2015-01-26 ENCOUNTER — Telehealth: Payer: Self-pay | Admitting: Internal Medicine

## 2015-01-26 NOTE — Telephone Encounter (Signed)
[facility] spoke with Jennifer Andrade/rep she stated that the Zetia need PA. Will be faxing over PA form to be completed...Jennifer Andrade

## 2015-01-26 NOTE — Telephone Encounter (Signed)
Patients spouse states [facility] is needing additional information in order to fill zetia.  States to call [facility] [phone]

## 2015-01-26 NOTE — Telephone Encounter (Signed)
Received PA form completed and fax back waiting on status...Jennifer Andrade

## 2015-01-27 NOTE — Telephone Encounter (Signed)
[facility] spoke with pharmacist inform med was approved. She stated medication will be mail out 5-8 days to pt...Johny Chess

## 2015-03-11 ENCOUNTER — Other Ambulatory Visit: Payer: Self-pay | Admitting: Internal Medicine

## 2015-03-24 ENCOUNTER — Telehealth: Payer: Self-pay | Admitting: *Deleted

## 2015-03-24 DIAGNOSIS — Z1231 Encounter for screening mammogram for malignant neoplasm of breast: Secondary | ICD-10-CM

## 2015-03-24 NOTE — Telephone Encounter (Signed)
I called pt to see when last mammogram was. She states its been more than 20 years. She requests order for mammogram. Order placed.

## 2015-12-02 ENCOUNTER — Other Ambulatory Visit: Payer: Self-pay | Admitting: Internal Medicine

## 2015-12-31 ENCOUNTER — Encounter: Payer: Self-pay | Admitting: Internal Medicine

## 2015-12-31 ENCOUNTER — Ambulatory Visit (INDEPENDENT_AMBULATORY_CARE_PROVIDER_SITE_OTHER): Payer: BLUE CROSS/BLUE SHIELD | Admitting: Internal Medicine

## 2015-12-31 ENCOUNTER — Other Ambulatory Visit: Payer: Self-pay | Admitting: Internal Medicine

## 2015-12-31 VITALS — BP 118/76 | HR 50 | Temp 98.2°F | Resp 20 | Wt 125.0 lb

## 2015-12-31 DIAGNOSIS — E039 Hypothyroidism, unspecified: Secondary | ICD-10-CM

## 2015-12-31 DIAGNOSIS — E785 Hyperlipidemia, unspecified: Secondary | ICD-10-CM | POA: Diagnosis not present

## 2015-12-31 DIAGNOSIS — Z0000 Encounter for general adult medical examination without abnormal findings: Secondary | ICD-10-CM

## 2015-12-31 DIAGNOSIS — I10 Essential (primary) hypertension: Secondary | ICD-10-CM | POA: Diagnosis not present

## 2015-12-31 MED ORDER — METOPROLOL TARTRATE 50 MG PO TABS: 50.0000 mg | ORAL_TABLET | Freq: Two times a day (BID) | ORAL | Status: DC

## 2015-12-31 MED ORDER — LOSARTAN POTASSIUM 50 MG PO TABS: 50.0000 mg | ORAL_TABLET | Freq: Every day | ORAL | Status: DC

## 2015-12-31 MED ORDER — LEVOTHYROXINE SODIUM 50 MCG PO TABS: ORAL_TABLET | ORAL | Status: DC

## 2015-12-31 MED ORDER — CLOBETASOL PROPIONATE 0.05 % EX CREA: TOPICAL_CREAM | CUTANEOUS | Status: DC

## 2015-12-31 MED ORDER — HYDROCORTISONE 2.5 % EX CREA: TOPICAL_CREAM | Freq: Two times a day (BID) | CUTANEOUS | Status: DC

## 2015-12-31 MED ORDER — EZETIMIBE 10 MG PO TABS: 10.0000 mg | ORAL_TABLET | Freq: Every day | ORAL | Status: DC

## 2015-12-31 NOTE — Progress Notes (Signed)
Pre visit review using our clinic review tool, if applicable. No additional management support is needed unless otherwise documented below in the visit note. 

## 2015-12-31 NOTE — Progress Notes (Signed)
Subjective:    Patient ID: Jennifer Andrade, female    DOB: Jan 04, 1951, 65 y.o.   MRN: [id number]  HPI  Here for wellness and f/u;  Overall doing ok;  Pt denies Chest pain, worsening SOB, DOE, wheezing, orthopnea, PND, worsening LE edema, palpitations, dizziness or syncope.  Pt denies neurological change such as new headache, facial or extremity weakness.  Pt denies polydipsia, polyuria, or low sugar symptoms. Pt states overall good compliance with treatment and medications, good tolerability, and has been trying to follow appropriate diet.  Pt denies worsening depressive symptoms, suicidal ideation or panic. No fever, night sweats, wt loss, loss of appetite, or other constitutional symptoms.  Pt states good ability with ADL's, has low fall risk, home safety reviewed and adequate, no other significant changes in hearing or vision, and only occasionally active with exercise. Quit smoking x 4 wks.  Decliens colonoscopy, and immunizaations.  Has been statin intolerant in the past. Admits to some element chronic depression, has not worked since her son died 56 yrs ago.  Denies hyper or hypo thyroid symptoms such as voice, skin or hair change. Denies hyper or hypo thyroid symptoms such as voice, skin or hair change. Past Medical History  Diagnosis Date  . HYPOTHYROIDISM 04/15/2008  . HYPERLIPIDEMIA 04/15/2008  . ANEMIA-IRON DEFICIENCY 04/15/2008  . Other specified forms of hearing loss 09/30/2010  . HYPERTENSION 04/15/2008  . SINUSITIS- ACUTE-NOS 09/30/2010  . LUMBAR RADICULOPATHY, LEFT 11/24/2009  . OSTEOPENIA 04/15/2008  . Migraine 03/25/2011  . Cataract   . Vitamin D deficiency 10/2011    level 16   Past Surgical History  Procedure Laterality Date  . Tubal ligation    . Cataract surg      lens replacement Bil  . Back surgery      reports that she has been smoking Cigarettes.  She has been smoking about 1.00 pack per day. She has never used smokeless tobacco. She reports that she drinks alcohol. She  reports that she does not use illicit drugs. family history includes Heart disease in her father; Hypertension in her father; Kidney disease in her father; Thyroid disease in her mother. Allergies  Allergen Reactions  . Atorvastatin     REACTION: myalgias  . Rosuvastatin     REACTION: memory problem   Current Outpatient Prescriptions on File Prior to Visit  Medication Sig Dispense Refill  . aspirin 81 MG EC tablet Take 81 mg by mouth daily.      . cetirizine (ZYRTEC) 10 MG tablet Take 1 tablet (10 mg total) by mouth daily. 30 tablet 11  . levothyroxine (SYNTHROID, LEVOTHROID) 50 MCG tablet TAKE 1 TABLET DAILY 90 tablet 3  . triamcinolone (NASACORT AQ) 55 MCG/ACT AERO nasal inhaler Place 2 sprays into the nose daily. 3 Inhaler 3   No current facility-administered medications on file prior to visit.    Review of Systems Constitutional: Negative for increased diaphoresis, other activity, appetite or siginficant weight change other than noted HENT: Negative for worsening hearing loss, ear pain, facial swelling, mouth sores and neck stiffness.   Eyes: Negative for other worsening pain, redness or visual disturbance.  Respiratory: Negative for shortness of breath and wheezing  Cardiovascular: Negative for chest pain and palpitations.  Gastrointestinal: Negative for diarrhea, blood in stool, abdominal distention or other pain Genitourinary: Negative for hematuria, flank pain or change in urine volume.  Musculoskeletal: Negative for myalgias or other joint complaints.  Skin: Negative for color change and wound or drainage.  Neurological:  Negative for syncope and numbness. other than noted Hematological: Negative for adenopathy. or other swelling Psychiatric/Behavioral: Negative for hallucinations, SI, self-injury, decreased concentration or other worsening agitation.      Objective:   Physical Exam BP 118/76 mmHg  Pulse 50  Temp(Src) 98.2 F (36.8 C) (Oral)  Resp 20  Wt 125 lb (56.7  kg)  SpO2 96%  LMP 10/24/2001 VS noted,  Constitutional: Pt is oriented to person, place, and time. Appears well-developed and well-nourished, in no significant distress Head: Normocephalic and atraumatic.  Right Ear: External ear normal.  Left Ear: External ear normal.  Nose: Nose normal.  Mouth/Throat: Oropharynx is clear and moist.  Eyes: Conjunctivae and EOM are normal. Pupils are equal, round, and reactive to light.  Neck: Normal range of motion. Neck supple. No JVD present. No tracheal deviation present or significant neck LA or mass Cardiovascular: Normal rate, regular rhythm, normal heart sounds and intact distal pulses.   Pulmonary/Chest: Effort normal and breath sounds without rales or wheezing  Abdominal: Soft. Bowel sounds are normal. NT. No HSM  Musculoskeletal: Normal range of motion. Exhibits no edema.  Lymphadenopathy:  Has no cervical adenopathy.  Neurological: Pt is alert and oriented to person, place, and time. Pt has normal reflexes. No cranial nerve deficit. Motor grossly intact Skin: Skin is warm and dry. No rash noted.  Psychiatric:  Has dysphoric mood and affect. Behavior is normal.      Assessment & Plan:

## 2015-12-31 NOTE — Patient Instructions (Signed)
Please continue all other medications as before, and refills have been done if requested - done 90 days to [facility]  Please have the pharmacy call with any other refills you may need.  Please continue your efforts at being more active, low cholesterol diet, and weight control.  You are otherwise up to date with prevention measures today.  Please keep your appointments with your specialists as you may have planned  Please go to the LAB in the Basement (turn left off the elevator) for the tests to be done today  You will be contacted by phone if any changes need to be made immediately.  Otherwise, you will receive a letter about your results with an explanation, but please check with MyChart first.  Please remember to sign up for MyChart if you have not done so, as this will be important to you in the future with finding out test results, communicating by private email, and scheduling acute appointments online when needed.  Please return in 1 year for your yearly visit, or sooner if needed, with Lab testing done 3-5 days before

## 2016-01-02 NOTE — Assessment & Plan Note (Signed)
stable overall by history and exam, recent data reviewed with pt, and pt to continue medical treatment as before,  to f/u any worsening symptoms or concerns BP Readings from Last 3 Encounters:  12/31/15 118/76  01/21/15 138/80  01/01/15 118/78

## 2016-01-02 NOTE — Assessment & Plan Note (Signed)

## 2016-01-02 NOTE — Assessment & Plan Note (Signed)
Asympt,  Lab Results  Component Value Date   TSH 6.290* 12/25/2014  for f/u lab

## 2016-01-02 NOTE — Assessment & Plan Note (Signed)
stable overall by history and exam, recent data reviewed with pt, and pt to continue medical treatment as before,  to f/u any worsening symptoms or concerns Lab Results  Component Value Date   LDLCALC 200* 12/25/2014   Statin intolerant, for lower chol diet

## 2016-07-04 ENCOUNTER — Telehealth: Payer: Self-pay | Admitting: Internal Medicine

## 2016-07-04 NOTE — Telephone Encounter (Signed)
Patient states she usually gets sick this time of year.  States she is going on a cruise to [place] for 21 days and she is afraid of getting sick.  She would like to know if Dr. Jenny Reichmann would write her a script for a z pak to take with her just in case.

## 2016-07-05 NOTE — Telephone Encounter (Signed)
Very sorry, but this is not usual medical practice, thanks

## 2016-07-05 NOTE — Telephone Encounter (Signed)
Called patient, unable to reach left message for patient to give us a call back.  

## 2016-09-08 ENCOUNTER — Ambulatory Visit (INDEPENDENT_AMBULATORY_CARE_PROVIDER_SITE_OTHER): Payer: BLUE CROSS/BLUE SHIELD | Admitting: Internal Medicine

## 2016-09-08 ENCOUNTER — Encounter: Payer: Self-pay | Admitting: Internal Medicine

## 2016-09-08 VITALS — BP 158/88 | HR 60 | Temp 98.0°F | Wt 117.0 lb

## 2016-09-08 DIAGNOSIS — I10 Essential (primary) hypertension: Secondary | ICD-10-CM

## 2016-09-08 DIAGNOSIS — E785 Hyperlipidemia, unspecified: Secondary | ICD-10-CM | POA: Diagnosis not present

## 2016-09-08 DIAGNOSIS — E039 Hypothyroidism, unspecified: Secondary | ICD-10-CM | POA: Diagnosis not present

## 2016-09-08 DIAGNOSIS — R05 Cough: Secondary | ICD-10-CM | POA: Diagnosis not present

## 2016-09-08 DIAGNOSIS — Z0001 Encounter for general adult medical examination with abnormal findings: Secondary | ICD-10-CM

## 2016-09-08 DIAGNOSIS — N259 Disorder resulting from impaired renal tubular function, unspecified: Secondary | ICD-10-CM

## 2016-09-08 DIAGNOSIS — R059 Cough, unspecified: Secondary | ICD-10-CM

## 2016-09-08 MED ORDER — AMOXICILLIN 500 MG PO CAPS: 1000.0000 mg | ORAL_CAPSULE | Freq: Two times a day (BID) | ORAL | 0 refills | Status: DC

## 2016-09-08 MED ORDER — METOPROLOL TARTRATE 25 MG PO TABS: 25.0000 mg | ORAL_TABLET | Freq: Two times a day (BID) | ORAL | 3 refills | Status: DC

## 2016-09-08 NOTE — Progress Notes (Signed)
Subjective:    Patient ID: Jennifer Andrade, female    DOB: [birth year], 65 y.o.   MRN: [id number]  HPI   Here to f/u; overall doing ok,  Pt denies chest pain, increasing sob or doe, wheezing, orthopnea, PND, increased LE swelling, palpitations, dizziness or syncope, except felt "funny" on BID dosing BB, so only takes in AM.   Pt denies new neurological symptoms such as new headache, or facial or extremity weakness or numbness.  Pt denies polydipsia, polyuria, or low sugar episode.   Pt denies new neurological symptoms such as new headache, or facial or extremity weakness or numbness.  Declines flu shot.  Incidentally  Here with 2-3 days acute onset fever, facial pain, pressure, headache, general weakness and malaise, and greenish d/c, with mild ST and cough.  Reminds me she is statin intolerant, zetia too expensive, wants to cont diet only. Denies hyper or hypo thyroid symptoms such as voice, skin or hair change.  Past Medical History:  Diagnosis Date  . ANEMIA-IRON DEFICIENCY 04/15/2008  . Cataract   . HYPERLIPIDEMIA 04/15/2008  . HYPERTENSION 04/15/2008  . HYPOTHYROIDISM 04/15/2008  . LUMBAR RADICULOPATHY, LEFT 11/24/2009  . Migraine 03/25/2011  . OSTEOPENIA 04/15/2008  . Other specified forms of hearing loss 09/30/2010  . SINUSITIS- ACUTE-NOS 09/30/2010  . Vitamin D deficiency 10/2011   level 16   Past Surgical History:  Procedure Laterality Date  . BACK SURGERY    . cataract surg     lens replacement Bil  . TUBAL LIGATION      reports that she has been smoking Cigarettes.  She has been smoking about 1.00 pack per day. She has never used smokeless tobacco. She reports that she drinks alcohol. She reports that she does not use drugs. family history includes Heart disease in her father; Hypertension in her father; Kidney disease in her father; Thyroid disease in her mother. Allergies  Allergen Reactions  . Atorvastatin     REACTION: myalgias  . Rosuvastatin     REACTION: memory problem    Current Outpatient Prescriptions on File Prior to Visit  Medication Sig Dispense Refill  . aspirin 81 MG EC tablet Take 81 mg by mouth daily.      . cetirizine (ZYRTEC) 10 MG tablet Take 1 tablet (10 mg total) by mouth daily. 30 tablet 11  . clobetasol cream (TEMOVATE) 0.05 % Apply daily as needed 30 g 1  . ezetimibe (ZETIA) 10 MG tablet Take 1 tablet (10 mg total) by mouth daily. 90 tablet 3  . hydrocortisone 2.5 % cream Apply topically 2 (two) times daily. 30 g 1  . levothyroxine (SYNTHROID, LEVOTHROID) 50 MCG tablet TAKE 1 TABLET DAILY 90 tablet 3  . levothyroxine (SYNTHROID, LEVOTHROID) 50 MCG tablet TAKE 1 TABLET DAILY 90 tablet 3  . losartan (COZAAR) 50 MG tablet Take 1 tablet (50 mg total) by mouth daily. 90 tablet 3  . triamcinolone (NASACORT AQ) 55 MCG/ACT AERO nasal inhaler Place 2 sprays into the nose daily. 3 Inhaler 3   No current facility-administered medications on file prior to visit.    Review of Systems  Constitutional: Negative for unusual diaphoresis or night sweats HENT: Negative for ear swelling or discharge Eyes: Negative for worsening visual haziness  Respiratory: Negative for choking and stridor.   Gastrointestinal: Negative for distension or worsening eructation Genitourinary: Negative for retention or change in urine volume.  Musculoskeletal: Negative for other MSK pain or swelling Skin: Negative for color change and worsening wound Neurological:  Negative for tremors and numbness other than noted  Psychiatric/Behavioral: Negative for decreased concentration or agitation other than above   All other system neg perpt    Objective:   Physical Exam BP (!) 158/88   Pulse 60   Temp 98 F (36.7 C)   Wt 117 lb (53.1 kg)   LMP 10/24/2001   SpO2 98%   BMI 18.88 kg/m  VS noted, thin for height Constitutional: Pt appears in no apparent distress HENT: Head: NCAT.  Right Ear: External ear normal.  Left Ear: External ear normal.  Bilat tm's with mild  erythema.  Max sinus areas mild tender.  Pharynx with mild erythema, no exudate Eyes: . Pupils are equal, round, and reactive to light. Conjunctivae and EOM are normal Neck: Normal range of motion. Neck supple.  Cardiovascular: Normal rate and regular rhythm.   Pulmonary/Chest: Effort normal and breath sounds without rales or wheezing.  Neurological: Pt is alert. Not confused , motor grossly intact Skin: Skin is warm. No rash, no LE edema Psychiatric: Pt behavior is normal. No agitation.  No other new exam findings    Assessment & Plan:

## 2016-09-08 NOTE — Progress Notes (Signed)
Pre visit review using our clinic review tool, if applicable. No additional management support is needed unless otherwise documented below in the visit note. 

## 2016-09-08 NOTE — Patient Instructions (Signed)
Please take all new medication as prescribed - the antibiotic, and the metoprolol at 25 mg twice per day  Please check your blood pressure at home twice per day for 1 wk, and call with the average   Please continue all other medications as before, and refills have been done if requested.  Please have the pharmacy call with any other refills you may need.  Please keep your appointments with your specialists as you may have planned  Please return in 6 months, or sooner if needed, with Lab testing done 3-5 days before

## 2016-09-10 NOTE — Assessment & Plan Note (Signed)
stable overall by history and exam, recent data reviewed with pt, and pt to continue medical treatment as before,  to f/u any worsening symptoms or concerns Lab Results  Component Value Date   CREATININE 1.08 12/25/2014

## 2016-09-10 NOTE — Assessment & Plan Note (Signed)
With URI/sinusitis, Mild to mod, for antibx course,  to f/u any worsening symptoms or concerns

## 2016-09-10 NOTE — Assessment & Plan Note (Signed)
Lab Results  Component Value Date   TSH 6.290 (H) 12/25/2014   stable overall by history and exam, recent data reviewed with pt, and pt to continue medical treatment as before,  to f/u any worsening symptoms or concerns

## 2016-09-10 NOTE — Assessment & Plan Note (Signed)
With feeling worse on metoprolo 50 bid, will reduce to 25 bid,  to f/u any worsening symptoms or concerns

## 2016-09-10 NOTE — Assessment & Plan Note (Signed)
Lab Results  Component Value Date   LDLCALC 200 (H) 12/25/2014   Severe, declines other statin or zetia trial, declines lipid clinic,  to f/u any worsening symptoms or concerns

## 2017-01-03 ENCOUNTER — Other Ambulatory Visit: Payer: Self-pay | Admitting: Internal Medicine

## 2017-01-30 ENCOUNTER — Other Ambulatory Visit: Payer: Self-pay | Admitting: *Deleted

## 2017-02-14 ENCOUNTER — Other Ambulatory Visit: Payer: Self-pay | Admitting: Internal Medicine

## 2017-02-14 ENCOUNTER — Other Ambulatory Visit (INDEPENDENT_AMBULATORY_CARE_PROVIDER_SITE_OTHER): Payer: BLUE CROSS/BLUE SHIELD

## 2017-02-14 ENCOUNTER — Encounter: Payer: Self-pay | Admitting: Internal Medicine

## 2017-02-14 ENCOUNTER — Ambulatory Visit (INDEPENDENT_AMBULATORY_CARE_PROVIDER_SITE_OTHER): Payer: BLUE CROSS/BLUE SHIELD | Admitting: Internal Medicine

## 2017-02-14 VITALS — BP 172/100 | HR 65 | Ht 67.0 in | Wt 128.0 lb

## 2017-02-14 DIAGNOSIS — E039 Hypothyroidism, unspecified: Secondary | ICD-10-CM

## 2017-02-14 DIAGNOSIS — I10 Essential (primary) hypertension: Secondary | ICD-10-CM

## 2017-02-14 DIAGNOSIS — R8290 Unspecified abnormal findings in urine: Secondary | ICD-10-CM | POA: Diagnosis not present

## 2017-02-14 DIAGNOSIS — Z0000 Encounter for general adult medical examination without abnormal findings: Secondary | ICD-10-CM | POA: Diagnosis not present

## 2017-02-14 DIAGNOSIS — Z0001 Encounter for general adult medical examination with abnormal findings: Secondary | ICD-10-CM | POA: Diagnosis not present

## 2017-02-14 LAB — URINALYSIS, ROUTINE W REFLEX MICROSCOPIC
Bilirubin Urine: NEGATIVE
Hgb urine dipstick: NEGATIVE
KETONES UR: NEGATIVE
Nitrite: NEGATIVE
RBC / HPF: NONE SEEN (ref 0–?)
SPECIFIC GRAVITY, URINE: 1.025 (ref 1.000–1.030)
Total Protein, Urine: NEGATIVE
Urine Glucose: NEGATIVE
Urobilinogen, UA: 0.2 (ref 0.0–1.0)
pH: 5.5 (ref 5.0–8.0)

## 2017-02-14 LAB — CBC WITH DIFFERENTIAL/PLATELET
BASOS PCT: 0.6 % (ref 0.0–3.0)
BASOS PCT: 0.8 % (ref 0.0–3.0)
Basophils Absolute: 0.1 10*3/uL (ref 0.0–0.1)
Basophils Absolute: 0.1 10*3/uL (ref 0.0–0.1)
Eosinophils Absolute: 0.1 10*3/uL (ref 0.0–0.7)
Eosinophils Absolute: 0.1 10*3/uL (ref 0.0–0.7)
Eosinophils Relative: 1.3 % (ref 0.0–5.0)
Eosinophils Relative: 1.5 % (ref 0.0–5.0)
HEMATOCRIT: 44.9 % (ref 36.0–46.0)
HEMATOCRIT: 45.2 % (ref 36.0–46.0)
Hemoglobin: 15 g/dL (ref 12.0–15.0)
Hemoglobin: 15 g/dL (ref 12.0–15.0)
LYMPHS ABS: 1.9 10*3/uL (ref 0.7–4.0)
LYMPHS PCT: 21.7 % (ref 12.0–46.0)
LYMPHS PCT: 22.6 % (ref 12.0–46.0)
Lymphs Abs: 2 10*3/uL (ref 0.7–4.0)
MCHC: 33.2 g/dL (ref 30.0–36.0)
MCHC: 33.5 g/dL (ref 30.0–36.0)
MCV: 92.3 fl (ref 78.0–100.0)
MCV: 92.8 fl (ref 78.0–100.0)
MONOS PCT: 10.8 % (ref 3.0–12.0)
MONOS PCT: 11.3 % (ref 3.0–12.0)
Monocytes Absolute: 1 10*3/uL (ref 0.1–1.0)
Monocytes Absolute: 1 10*3/uL (ref 0.1–1.0)
NEUTROS ABS: 5.6 10*3/uL (ref 1.4–7.7)
NEUTROS ABS: 5.8 10*3/uL (ref 1.4–7.7)
NEUTROS PCT: 65.4 % (ref 43.0–77.0)
Neutrophils Relative %: 64 % (ref 43.0–77.0)
PLATELETS: 205 10*3/uL (ref 150.0–400.0)
PLATELETS: 210 10*3/uL (ref 150.0–400.0)
RBC: 4.86 Mil/uL (ref 3.87–5.11)
RBC: 4.86 Mil/uL (ref 3.87–5.11)
RDW: 13.5 % (ref 11.5–15.5)
RDW: 13.8 % (ref 11.5–15.5)
WBC: 8.7 10*3/uL (ref 4.0–10.5)
WBC: 8.8 10*3/uL (ref 4.0–10.5)

## 2017-02-14 LAB — HEPATIC FUNCTION PANEL
ALK PHOS: 52 U/L (ref 39–117)
ALT: 9 U/L (ref 0–35)
AST: 15 U/L (ref 0–37)
Albumin: 4.8 g/dL (ref 3.5–5.2)
BILIRUBIN DIRECT: 0.1 mg/dL (ref 0.0–0.3)
BILIRUBIN TOTAL: 0.5 mg/dL (ref 0.2–1.2)
Total Protein: 8.4 g/dL — ABNORMAL HIGH (ref 6.0–8.3)

## 2017-02-14 LAB — BASIC METABOLIC PANEL
BUN: 23 mg/dL (ref 6–23)
CALCIUM: 10.5 mg/dL (ref 8.4–10.5)
CHLORIDE: 101 meq/L (ref 96–112)
CO2: 28 mEq/L (ref 19–32)
CREATININE: 1.02 mg/dL (ref 0.40–1.20)
GFR: 57.94 mL/min — ABNORMAL LOW (ref >60.00)
Glucose, Bld: 95 mg/dL (ref 70–99)
Potassium: 4.8 mEq/L (ref 3.5–5.1)
SODIUM: 137 meq/L (ref 135–145)

## 2017-02-14 LAB — LIPID PANEL
CHOL/HDL RATIO: 6
Cholesterol: 359 mg/dL — ABNORMAL HIGH (ref 0–200)
HDL: 55.4 mg/dL (ref >39.00)
LDL Cholesterol: 271 mg/dL — ABNORMAL HIGH (ref 0–99)
NONHDL: 303.64
Triglycerides: 163 mg/dL — ABNORMAL HIGH (ref 0.0–149.0)
VLDL: 32.6 mg/dL (ref 0.0–40.0)

## 2017-02-14 LAB — TSH: TSH: 6.83 u[IU]/mL — ABNORMAL HIGH (ref 0.35–4.50)

## 2017-02-14 MED ORDER — LEVOTHYROXINE SODIUM 75 MCG PO TABS: 75.0000 ug | ORAL_TABLET | Freq: Every day | ORAL | 3 refills | Status: DC

## 2017-02-14 MED ORDER — METOPROLOL TARTRATE 25 MG PO TABS: 25.0000 mg | ORAL_TABLET | Freq: Two times a day (BID) | ORAL | 3 refills | Status: DC

## 2017-02-14 MED ORDER — LEVOTHYROXINE SODIUM 50 MCG PO TABS: 50.0000 ug | ORAL_TABLET | Freq: Every day | ORAL | 3 refills | Status: DC

## 2017-02-14 MED ORDER — VALSARTAN 320 MG PO TABS: 320.0000 mg | ORAL_TABLET | Freq: Every day | ORAL | 3 refills | Status: DC

## 2017-02-14 NOTE — Progress Notes (Signed)
Subjective:    Patient ID: Jennifer Andrade, female    DOB: 1950/11/12, 66 y.o.   MRN: [id number]  HPI  Here for wellness and f/u;  Overall doing ok;  Pt denies Chest pain, worsening SOB, DOE, wheezing, orthopnea, PND, worsening LE edema, palpitations, dizziness or syncope.  Pt denies neurological change such as new headache, facial or extremity weakness.  Pt denies polydipsia, polyuria, or low sugar symptoms. Pt states overall good compliance with treatment and medications, good tolerability, and has been trying to follow appropriate diet.  Pt denies worsening depressive symptoms, suicidal ideation or panic. No fever, night sweats, wt loss, loss of appetite, or other constitutional symptoms.  Pt states good ability with ADL's, has low fall risk, home safety reviewed and adequate, no other significant changes in hearing or vision, and only occasionally active with exercise.  Has pap due, but declines colonoscopy and immunizations.  Did quit smoking dec 6, wt up 10 lbs since then,does not think she will go back to it.  Admits to dietary indiscretions with too many buscuits , and carbs Wt Readings from Last 3 Encounters:  02/14/17 128 lb (58.1 kg)  09/08/16 117 lb (53.1 kg)  12/31/15 125 lb (56.7 kg)   BP Readings from Last 3 Encounters:  02/14/17 (!) 172/100  09/08/16 (!) 158/88  12/31/15 118/76   Past Medical History:  Diagnosis Date  . ANEMIA-IRON DEFICIENCY 04/15/2008  . Cataract   . HYPERLIPIDEMIA 04/15/2008  . HYPERTENSION 04/15/2008  . HYPOTHYROIDISM 04/15/2008  . LUMBAR RADICULOPATHY, LEFT 11/24/2009  . Migraine 03/25/2011  . OSTEOPENIA 04/15/2008  . Other specified forms of hearing loss 09/30/2010  . SINUSITIS- ACUTE-NOS 09/30/2010  . Vitamin D deficiency 10/2011   level 16   Past Surgical History:  Procedure Laterality Date  . BACK SURGERY    . cataract surg     lens replacement Bil  . TUBAL LIGATION      reports that she quit smoking about 4 months ago. Her smoking use included  Cigarettes. She smoked 1.00 pack per day. She has never used smokeless tobacco. She reports that she drinks alcohol. She reports that she does not use drugs. family history includes Heart disease in her father; Hypertension in her father; Kidney disease in her father; Thyroid disease in her mother. Allergies  Allergen Reactions  . Atorvastatin     REACTION: myalgias  . Rosuvastatin     REACTION: memory problem   Current Outpatient Prescriptions on File Prior to Visit  Medication Sig Dispense Refill  . aspirin 81 MG EC tablet Take 81 mg by mouth daily.      . cetirizine (ZYRTEC) 10 MG tablet Take 1 tablet (10 mg total) by mouth daily. 30 tablet 11  . clobetasol cream (TEMOVATE) 0.05 % Apply daily as needed 30 g 1  . hydrocortisone 2.5 % cream Apply topically 2 (two) times daily. 30 g 1  . triamcinolone (NASACORT AQ) 55 MCG/ACT AERO nasal inhaler Place 2 sprays into the nose daily. 3 Inhaler 3   No current facility-administered medications on file prior to visit.    Review of Systems  Constitutional: Negative for other unusual diaphoresis, sweats, appetite or weight changes HENT: Negative for other worsening hearing loss, ear pain, facial swelling, mouth sores or neck stiffness.   Eyes: Negative for other worsening pain, redness or other visual disturbance.  Respiratory: Negative for other stridor or swelling Cardiovascular: Negative for other palpitations or other chest pain  Gastrointestinal: Negative for worsening diarrhea or  loose stools, blood in stool, distention or other pain Genitourinary: Negative for hematuria, flank pain or other change in urine volume.  Musculoskeletal: Negative for myalgias or other joint swelling.  Skin: Negative for other color change, or other wound or worsening drainage.  Neurological: Negative for other syncope or numbness. Hematological: Negative for other adenopathy or swelling Psychiatric/Behavioral: Negative for hallucinations, other worsening  agitation, SI, self-injury, or new decreased concentration All other system neg per pt    Objective:   Physical Exam BP (!) 172/100   Pulse 65   Ht 5\' 7"  (1.702 m)   Wt 128 lb (58.1 kg)   LMP 10/24/2001   SpO2 97%   BMI 20.05 kg/m   VS noted,  Constitutional: Pt is oriented to person, place, and time. Appears well-developed and well-nourished, in no significant distress and comfortable Head: Normocephalic and atraumatic  Eyes: Conjunctivae and EOM are normal. Pupils are equal, round, and reactive to light Right Ear: External ear normal without discharge Left Ear: External ear normal without discharge Nose: Nose without discharge or deformity Mouth/Throat: Oropharynx is without other ulcerations and moist  Neck: Normal range of motion. Neck supple. No JVD present. No tracheal deviation present or significant neck LA or mass Cardiovascular: Normal rate, regular rhythm, normal heart sounds and intact distal pulses.   Pulmonary/Chest: WOB normal and breath sounds without rales or wheezing  Abdominal: Soft. Bowel sounds are normal. NT. No HSM  Musculoskeletal: Normal range of motion. Exhibits no edema Lymphadenopathy: Has no other cervical adenopathy.  Neurological: Pt is alert and oriented to person, place, and time. Pt has normal reflexes. No cranial nerve deficit. Motor grossly intact, Gait intact Skin: Skin is warm and dry. No rash noted or new ulcerations Psychiatric:  Has nervous depressed mood and affect. Behavior is normal without agitation No other exam findings    Assessment & Plan:

## 2017-02-14 NOTE — Assessment & Plan Note (Signed)

## 2017-02-14 NOTE — Assessment & Plan Note (Signed)
stable overall by history and exam, recent data reviewed with pt, and pt to continue medical treatment as before,  to f/u any worsening symptoms or concerns, for f/u lab today 

## 2017-02-14 NOTE — Assessment & Plan Note (Signed)
Uncontrolled, for change losartan 50 to diovan 320 qd

## 2017-02-14 NOTE — Progress Notes (Signed)
Pre visit review using our clinic review tool, if applicable. No additional management support is needed unless otherwise documented below in the visit note. 

## 2017-02-14 NOTE — Patient Instructions (Signed)
OK to stop the losartan (cozaar)  Please take all new medication as prescribed - the diovan 320 mg per day  Please continue all other medications as before, and refills have been done if requested.  Please have the pharmacy call with any other refills you may need.  Please continue your efforts at being more active, low cholesterol diet, and weight control.  You are otherwise up to date with prevention measures today.  Please keep your appointments with your specialists as you may have planned  Please go to the LAB in the Basement (turn left off the elevator) for the tests to be done today  You will be contacted by phone if any changes need to be made immediately.  Otherwise, you will receive a letter about your results with an explanation, but please check with MyChart first.  Please remember to sign up for MyChart if you have not done so, as this will be important to you in the future with finding out test results, communicating by private email, and scheduling acute appointments online when needed.  Please return in 3 months, or sooner if needed

## 2017-02-15 ENCOUNTER — Telehealth: Payer: Self-pay

## 2017-02-15 NOTE — Telephone Encounter (Signed)
-----   Message from Biagio Borg, MD sent at 02/14/2017  6:57 PM EDT ----- Left message on MyChart, pt to cont same tx except  he test results show that your current treatment is OK, except the LDL cholesterol is very severely elevated, and the TSH is mild elevated - indicating mild low thyroid.  Please follow a lower cholesterol diet as you have not been able to take the statins.  We will need to increase the thyroid medication from 50 to 75 per day.  A new prescription will be sent, and you should hear from the office.Redmond Baseman to please inform pt, I will do rx

## 2017-02-15 NOTE — Telephone Encounter (Signed)
Pt was informed and expressed understanding.  

## 2017-05-17 ENCOUNTER — Ambulatory Visit: Payer: BLUE CROSS/BLUE SHIELD | Admitting: Internal Medicine

## 2017-06-13 ENCOUNTER — Other Ambulatory Visit: Payer: Self-pay | Admitting: Internal Medicine

## 2017-06-13 MED ORDER — IRBESARTAN 300 MG PO TABS: 300.0000 mg | ORAL_TABLET | Freq: Every day | ORAL | 3 refills | Status: DC

## 2017-06-13 NOTE — Telephone Encounter (Signed)
diovan changed to avapro 300 qd for pt/pharmacy request

## 2017-07-25 ENCOUNTER — Ambulatory Visit (INDEPENDENT_AMBULATORY_CARE_PROVIDER_SITE_OTHER): Payer: BLUE CROSS/BLUE SHIELD | Admitting: Internal Medicine

## 2017-07-25 ENCOUNTER — Encounter: Payer: Self-pay | Admitting: Internal Medicine

## 2017-07-25 VITALS — BP 112/68 | HR 64 | Temp 97.7°F | Ht 67.0 in | Wt 126.0 lb

## 2017-07-25 DIAGNOSIS — F411 Generalized anxiety disorder: Secondary | ICD-10-CM

## 2017-07-25 DIAGNOSIS — K130 Diseases of lips: Secondary | ICD-10-CM | POA: Diagnosis not present

## 2017-07-25 DIAGNOSIS — I10 Essential (primary) hypertension: Secondary | ICD-10-CM

## 2017-07-25 MED ORDER — KETOCONAZOLE 2 % EX CREA: 1.0000 "application " | TOPICAL_CREAM | Freq: Every day | CUTANEOUS | 0 refills | Status: DC

## 2017-07-25 MED ORDER — AZITHROMYCIN 250 MG PO TABS: ORAL_TABLET | ORAL | 0 refills | Status: DC

## 2017-07-25 NOTE — Assessment & Plan Note (Signed)
stable overall by history and exam, and pt to continue medical treatment as before,  to f/u any worsening symptoms or concerns 

## 2017-07-25 NOTE — Assessment & Plan Note (Signed)
D/w pt , possible fungal vs b12 vs other - for ketoconozole cr prn, oral Otc b12, and zpack x 1 as cant r/o facial cellulitis

## 2017-07-25 NOTE — Assessment & Plan Note (Signed)
stable overall by history and exam, recent data reviewed with pt, and pt to continue medical treatment as before,  to f/u any worsening symptoms or concerns BP Readings from Last 3 Encounters:  07/25/17 112/68  02/14/17 (!) 172/100  09/08/16 (!) 158/88  \

## 2017-07-25 NOTE — Patient Instructions (Signed)
Please take all new medication as prescribed - the antibiotic, also ketoconozole cream daily  Please also start OTC Vitamin B12 - 1 per day  Please continue all other medications as before, and refills have been done if requested.  Please have the pharmacy call with any other refills you may need.  Please keep your appointments with your specialists as you may have planned

## 2017-07-25 NOTE — Progress Notes (Signed)
Subjective:    Patient ID: Jennifer Andrade, female    DOB: 12-Oct-1951, 66 y.o.   MRN: [id number]  HPI  Here with new onset right lateral mouth with painful cracking and now mild incresaed erythema and pain and itching for the past 3 days. No high fever, ST, cough, or tongue coating or hx of b12 deficiency.  Has tried steroid cream but did not help. O/w states "I feel great" and quite pleased with her care. Pt denies chest pain, increased sob or doe, wheezing, orthopnea, PND, increased LE swelling, palpitations, dizziness or syncope.  Pt denies new neurological symptoms such as new headache, or facial or extremity weakness or numbness   Pt denies polydipsia, polyuria, or low sugar symptoms such as weakness or confusion improved with po intake.  Pt states overall good compliance with meds. Denies worsening depressive symptoms, suicidal ideation, or panic Past Medical History:  Diagnosis Date  . ANEMIA-IRON DEFICIENCY 04/15/2008  . Cataract   . HYPERLIPIDEMIA 04/15/2008  . HYPERTENSION 04/15/2008  . HYPOTHYROIDISM 04/15/2008  . LUMBAR RADICULOPATHY, LEFT 11/24/2009  . Migraine 03/25/2011  . OSTEOPENIA 04/15/2008  . Other specified forms of hearing loss 09/30/2010  . SINUSITIS- ACUTE-NOS 09/30/2010  . Vitamin D deficiency 10/2011   level 16   Past Surgical History:  Procedure Laterality Date  . BACK SURGERY    . cataract surg     lens replacement Bil  . TUBAL LIGATION      reports that she quit smoking about 9 months ago. Her smoking use included Cigarettes. She smoked 1.00 pack per day. She has never used smokeless tobacco. She reports that she drinks alcohol. She reports that she does not use drugs. family history includes Heart disease in her father; Hypertension in her father; Kidney disease in her father; Thyroid disease in her mother. Allergies  Allergen Reactions  . Atorvastatin     REACTION: myalgias  . Rosuvastatin     REACTION: memory problem   Current Outpatient Prescriptions on  File Prior to Visit  Medication Sig Dispense Refill  . aspirin 81 MG EC tablet Take 81 mg by mouth daily.      . cetirizine (ZYRTEC) 10 MG tablet Take 1 tablet (10 mg total) by mouth daily. 30 tablet 11  . clobetasol cream (TEMOVATE) 0.05 % Apply daily as needed 30 g 1  . hydrocortisone 2.5 % cream Apply topically 2 (two) times daily. 30 g 1  . irbesartan (AVAPRO) 300 MG tablet Take 1 tablet (300 mg total) by mouth daily. 90 tablet 3  . levothyroxine (SYNTHROID, LEVOTHROID) 75 MCG tablet Take 1 tablet (75 mcg total) by mouth daily. 90 tablet 3  . metoprolol tartrate (LOPRESSOR) 25 MG tablet Take 1 tablet (25 mg total) by mouth 2 (two) times daily. 180 tablet 3  . triamcinolone (NASACORT AQ) 55 MCG/ACT AERO nasal inhaler Place 2 sprays into the nose daily. 3 Inhaler 3   No current facility-administered medications on file prior to visit.    Review of Systems  Constitutional: Negative for other unusual diaphoresis or sweats HENT: Negative for ear discharge or swelling Eyes: Negative for other worsening visual disturbances Respiratory: Negative for stridor or other swelling  Gastrointestinal: Negative for worsening distension or other blood Genitourinary: Negative for retention or other urinary change Musculoskeletal: Negative for other MSK pain or swelling Skin: Negative for color change or other new lesions Neurological: Negative for worsening tremors and other numbness  Psychiatric/Behavioral: Negative for worsening agitation or other fatigue All other  system neg per pt    Objective:   Physical Exam BP 112/68   Pulse 64   Temp 97.7 F (36.5 C) (Oral)   Ht 5\' 7"  (1.702 m)   Wt 126 lb (57.2 kg)   LMP 10/24/2001   SpO2 97%   BMI 19.73 kg/m  VS noted,  Constitutional: Pt appears in NAD HENT: Head: NCAT.  Right Ear: External ear normal.  Left Ear: External ear normal.  Eyes: . Pupils are equal, round, and reactive to light. Conjunctivae and EOM are normal Mouth with 1-2+  lateral cracking erythema tender with slight swelling but no drainage Nose: without d/c or deformity Neck: Neck supple. Gross normal ROM Cardiovascular: Normal rate and regular rhythm.   Pulmonary/Chest: Effort normal and breath sounds without rales or wheezing.  Neurological: Pt is alert. At baseline orientation, motor grossly intact Skin: Skin is warm. No rashes, other new lesions, no LE edema Psychiatric: Pt behavior is normal without agitation  No other exam findings      Assessment & Plan:

## 2018-01-25 ENCOUNTER — Other Ambulatory Visit: Payer: Self-pay | Admitting: Internal Medicine

## 2018-03-08 ENCOUNTER — Other Ambulatory Visit: Payer: Self-pay | Admitting: Internal Medicine

## 2018-04-10 ENCOUNTER — Ambulatory Visit (INDEPENDENT_AMBULATORY_CARE_PROVIDER_SITE_OTHER): Payer: Medicare HMO | Admitting: Internal Medicine

## 2018-04-10 ENCOUNTER — Encounter: Payer: Self-pay | Admitting: Internal Medicine

## 2018-04-10 VITALS — BP 136/84 | HR 67 | Temp 97.9°F | Ht 67.0 in | Wt 131.0 lb

## 2018-04-10 DIAGNOSIS — Z1159 Encounter for screening for other viral diseases: Secondary | ICD-10-CM

## 2018-04-10 DIAGNOSIS — Z114 Encounter for screening for human immunodeficiency virus [HIV]: Secondary | ICD-10-CM | POA: Diagnosis not present

## 2018-04-10 DIAGNOSIS — Z0000 Encounter for general adult medical examination without abnormal findings: Secondary | ICD-10-CM | POA: Diagnosis not present

## 2018-04-10 DIAGNOSIS — E785 Hyperlipidemia, unspecified: Secondary | ICD-10-CM | POA: Diagnosis not present

## 2018-04-10 DIAGNOSIS — R69 Illness, unspecified: Secondary | ICD-10-CM | POA: Diagnosis not present

## 2018-04-10 MED ORDER — LEVOTHYROXINE SODIUM 75 MCG PO TABS: 75.0000 ug | ORAL_TABLET | Freq: Every day | ORAL | 3 refills | Status: DC

## 2018-04-10 MED ORDER — METOPROLOL TARTRATE 25 MG PO TABS: 25.0000 mg | ORAL_TABLET | Freq: Two times a day (BID) | ORAL | 3 refills | Status: DC

## 2018-04-10 MED ORDER — IRBESARTAN 300 MG PO TABS: 300.0000 mg | ORAL_TABLET | Freq: Every day | ORAL | 3 refills | Status: DC

## 2018-04-10 NOTE — Progress Notes (Signed)
Subjective:    Patient ID: Jennifer Andrade, female    DOB: [birth year], 67 y.o.   MRN: [id number]  HPI  Here for wellness and f/u;  Overall doing ok;  Pt denies Chest pain, worsening SOB, DOE, wheezing, orthopnea, PND, worsening LE edema, palpitations, dizziness or syncope.  Pt denies neurological change such as new headache, facial or extremity weakness.  Pt denies polydipsia, polyuria, or low sugar symptoms. Pt states overall good compliance with treatment and medications, good tolerability, and has been trying to follow appropriate diet.  Pt denies worsening depressive symptoms, suicidal ideation or panic. No fever, night sweats, wt loss, loss of appetite, or other constitutional symptoms.  Pt states good ability with ADL's, has low fall risk, home safety reviewed and adequate, no other significant changes in hearing or vision, and only occasionally active with exercise. No new complaints or interval hx.  Declines all immunizations, colonoscopy and dxa.   Wt Readings from Last 3 Encounters:  04/10/18 131 lb (59.4 kg)  07/25/17 126 lb (57.2 kg)  02/14/17 128 lb (58.1 kg)   BP Readings from Last 3 Encounters:  04/10/18 136/84  07/25/17 112/68  02/14/17 (!) 172/100   Past Medical History:  Diagnosis Date  . ANEMIA-IRON DEFICIENCY 04/15/2008  . Cataract   . HYPERLIPIDEMIA 04/15/2008  . HYPERTENSION 04/15/2008  . HYPOTHYROIDISM 04/15/2008  . LUMBAR RADICULOPATHY, LEFT 11/24/2009  . Migraine 03/25/2011  . OSTEOPENIA 04/15/2008  . Other specified forms of hearing loss 09/30/2010  . SINUSITIS- ACUTE-NOS 09/30/2010  . Vitamin D deficiency 10/2011   level 16   Past Surgical History:  Procedure Laterality Date  . BACK SURGERY    . cataract surg     lens replacement Bil  . TUBAL LIGATION      reports that she quit smoking about 18 months ago. Her smoking use included cigarettes. She smoked 1.00 pack per day. She has never used smokeless tobacco. She reports that she drinks alcohol. She reports that  she does not use drugs. family history includes Heart disease in her father; Hypertension in her father; Kidney disease in her father; Thyroid disease in her mother. Allergies  Allergen Reactions  . Atorvastatin     REACTION: myalgias  . Rosuvastatin     REACTION: memory problem   Current Outpatient Medications on File Prior to Visit  Medication Sig Dispense Refill  . aspirin 81 MG EC tablet Take 81 mg by mouth daily.       No current facility-administered medications on file prior to visit.    Review of Systems Constitutional: Negative for other unusual diaphoresis, sweats, appetite or weight changes HENT: Negative for other worsening hearing loss, ear pain, facial swelling, mouth sores or neck stiffness.   Eyes: Negative for other worsening pain, redness or other visual disturbance.  Respiratory: Negative for other stridor or swelling Cardiovascular: Negative for other palpitations or other chest pain  Gastrointestinal: Negative for worsening diarrhea or loose stools, blood in stool, distention or other pain Genitourinary: Negative for hematuria, flank pain or other change in urine volume.  Musculoskeletal: Negative for myalgias or other joint swelling.  Skin: Negative for other color change, or other wound or worsening drainage.  Neurological: Negative for other syncope or numbness. Hematological: Negative for other adenopathy or swelling Psychiatric/Behavioral: Negative for hallucinations, other worsening agitation, SI, self-injury, or new decreased concentration \\All  other system neg per pt    Objective:   Physical Exam BP 136/84   Pulse 67   Temp 97.9 F (  36.6 C) (Oral)   Ht 5\' 7"  (1.702 m)   Wt 131 lb (59.4 kg)   LMP 10/24/2001   SpO2 95%   BMI 20.52 kg/m  VS noted,  Constitutional: Pt is oriented to person, place, and time. Appears well-developed and well-nourished, in no significant distress and comfortable Head: Normocephalic and atraumatic  Eyes: Conjunctivae  and EOM are normal. Pupils are equal, round, and reactive to light Right Ear: External ear normal without discharge Left Ear: External ear normal without discharge Nose: Nose without discharge or deformity Mouth/Throat: Oropharynx is without other ulcerations and moist  Neck: Normal range of motion. Neck supple. No JVD present. No tracheal deviation present or significant neck LA or mass Cardiovascular: Normal rate, regular rhythm, normal heart sounds and intact distal pulses.   Pulmonary/Chest: WOB normal and breath sounds without rales or wheezing  Abdominal: Soft. Bowel sounds are normal. NT. No HSM  Musculoskeletal: Normal range of motion. Exhibits no edema Lymphadenopathy: Has no other cervical adenopathy.  Neurological: Pt is alert and oriented to person, place, and time. Pt has normal reflexes. No cranial nerve deficit. Motor grossly intact, Gait intact Skin: Skin is warm and dry. No rash noted or new ulcerations Psychiatric:  Has normal mood and affect. Behavior is normal without agitation No other exam findings  Lab Results  Component Value Date   WBC 8.8 02/14/2017   WBC 8.7 02/14/2017   HGB 15.0 02/14/2017   HGB 15.0 02/14/2017   HCT 45.2 02/14/2017   HCT 44.9 02/14/2017   PLT 205.0 02/14/2017   PLT 210.0 02/14/2017   GLUCOSE 95 02/14/2017   CHOL 359 (H) 02/14/2017   TRIG 163.0 (H) 02/14/2017   HDL 55.40 02/14/2017   LDLDIRECT 195.2 03/22/2011   LDLCALC 271 (H) 02/14/2017   ALT 9 02/14/2017   AST 15 02/14/2017   NA 137 02/14/2017   K 4.8 02/14/2017   CL 101 02/14/2017   CREATININE 1.02 02/14/2017   BUN 23 02/14/2017   CO2 28 02/14/2017   TSH 6.83 (H) 02/14/2017        Assessment & Plan:

## 2018-04-10 NOTE — Assessment & Plan Note (Signed)

## 2018-04-10 NOTE — Assessment & Plan Note (Signed)
Lab Results  Component Value Date   LDLCALC 271 (H) 02/14/2017  for f/u today, has been statin intolerant, consider repatha

## 2018-04-10 NOTE — Patient Instructions (Signed)

## 2018-05-02 ENCOUNTER — Other Ambulatory Visit: Payer: Self-pay | Admitting: Internal Medicine

## 2018-05-07 ENCOUNTER — Other Ambulatory Visit: Payer: Self-pay | Admitting: Internal Medicine

## 2018-05-07 MED ORDER — CANDESARTAN CILEXETIL 32 MG PO TABS: ORAL_TABLET | ORAL | 3 refills | Status: DC

## 2018-07-12 ENCOUNTER — Other Ambulatory Visit: Payer: Self-pay | Admitting: Internal Medicine

## 2018-07-16 ENCOUNTER — Telehealth: Payer: Self-pay | Admitting: Emergency Medicine

## 2018-07-16 MED ORDER — CANDESARTAN CILEXETIL 32 MG PO TABS: ORAL_TABLET | ORAL | 3 refills | Status: DC

## 2018-07-16 NOTE — Telephone Encounter (Signed)
Done erx 

## 2018-07-16 NOTE — Telephone Encounter (Signed)
Routing to dr john, please advise, thanks 

## 2018-07-16 NOTE — Telephone Encounter (Signed)
Pt came in the office stated her original blood pressure medication was changed to candesartan (ATACAND) 32 MG tablet. She stated the pharmacy is needing directions, amount of refills and quanity for the medication sent to the pharmacy. Thanks.

## 2018-07-17 NOTE — Telephone Encounter (Signed)
Patient advised, she will pick up new rx, call back if any further questions

## 2019-03-15 ENCOUNTER — Ambulatory Visit (INDEPENDENT_AMBULATORY_CARE_PROVIDER_SITE_OTHER): Payer: Medicare HMO | Admitting: Internal Medicine

## 2019-03-15 ENCOUNTER — Ambulatory Visit: Payer: Self-pay | Admitting: *Deleted

## 2019-03-15 ENCOUNTER — Encounter: Payer: Self-pay | Admitting: Internal Medicine

## 2019-03-15 DIAGNOSIS — K580 Irritable bowel syndrome with diarrhea: Secondary | ICD-10-CM

## 2019-03-15 DIAGNOSIS — E611 Iron deficiency: Secondary | ICD-10-CM | POA: Diagnosis not present

## 2019-03-15 DIAGNOSIS — Z0001 Encounter for general adult medical examination with abnormal findings: Secondary | ICD-10-CM | POA: Diagnosis not present

## 2019-03-15 DIAGNOSIS — E039 Hypothyroidism, unspecified: Secondary | ICD-10-CM

## 2019-03-15 DIAGNOSIS — L309 Dermatitis, unspecified: Secondary | ICD-10-CM

## 2019-03-15 DIAGNOSIS — E559 Vitamin D deficiency, unspecified: Secondary | ICD-10-CM | POA: Diagnosis not present

## 2019-03-15 DIAGNOSIS — K589 Irritable bowel syndrome without diarrhea: Secondary | ICD-10-CM | POA: Insufficient documentation

## 2019-03-15 DIAGNOSIS — E538 Deficiency of other specified B group vitamins: Secondary | ICD-10-CM

## 2019-03-15 DIAGNOSIS — R69 Illness, unspecified: Secondary | ICD-10-CM | POA: Diagnosis not present

## 2019-03-15 DIAGNOSIS — K921 Melena: Secondary | ICD-10-CM

## 2019-03-15 DIAGNOSIS — F411 Generalized anxiety disorder: Secondary | ICD-10-CM

## 2019-03-15 DIAGNOSIS — Z1159 Encounter for screening for other viral diseases: Secondary | ICD-10-CM | POA: Diagnosis not present

## 2019-03-15 MED ORDER — HYDROCORTISONE (PERIANAL) 2.5 % EX CREA: 1.0000 "application " | TOPICAL_CREAM | Freq: Two times a day (BID) | CUTANEOUS | 0 refills | Status: DC

## 2019-03-15 MED ORDER — LEVOTHYROXINE SODIUM 75 MCG PO TABS: 75.0000 ug | ORAL_TABLET | Freq: Every day | ORAL | 3 refills | Status: DC

## 2019-03-15 MED ORDER — CANDESARTAN CILEXETIL 32 MG PO TABS: ORAL_TABLET | ORAL | 3 refills | Status: DC

## 2019-03-15 MED ORDER — METOPROLOL TARTRATE 25 MG PO TABS: 25.0000 mg | ORAL_TABLET | Freq: Two times a day (BID) | ORAL | 3 refills | Status: DC

## 2019-03-15 MED ORDER — TRIAMCINOLONE ACETONIDE 0.1 % EX CREA: 1.0000 "application " | TOPICAL_CREAM | Freq: Two times a day (BID) | CUTANEOUS | 2 refills | Status: DC

## 2019-03-15 NOTE — Assessment & Plan Note (Signed)
By hx likely c/w mild external hemorrhoid, for anusol HC cream prn, refer colonoscopy as is due, to f/u any worsening symptoms or concerns

## 2019-03-15 NOTE — Telephone Encounter (Signed)
Pt called with complaints of rectal bleeding; she says that she noticed it when she wiped after having a bowel movement; she did not notice blood on the tissue; this occurred today around 1100; the pt says that she has not take aspirin since her last visit; the pt says that she had soft stools twice on 5/21 and once today; recommendations made per nurse triage protocol; the pt verbalizes understanding but would like to see Dr Jenny Reichmann, pt transferred to [facility] for scheduling; will route to office for notification.   Reason for Disposition . Rectal bleeding is minimal (e.g., blood just on toilet paper, a few drops in toilet bowl)  Answer Assessment - Initial Assessment Questions 1. APPEARANCE of BLOOD: "What color is it?" "Is it passed separately, on the surface of the stool, or mixed in with the stool?"      Mixed with stool, red 2. AMOUNT: "How much blood was passed?"      Larger than a smear 3. FREQUENCY: "How many times has blood been passed with the stools?"      1 4. ONSET: "When was the blood first seen in the stools?" (Days or weeks)     03/15/2019 at 1100 5. DIARRHEA: "Is there also some diarrhea?" If so, ask: "How many diarrhea stools were passed in past 24 hours?"      Yes soft stools twice today and once on 03/14/2019 6. CONSTIPATION: "Do you have constipation?" If so, "How bad is it?"    no 7. RECURRENT SYMPTOMS: "Have you had blood in your stools before?" If so, ask: "When was the last time?" and "What happened that time?"      no 8. BLOOD THINNERS: "Do you take any blood thinners?" (e.g., Coumadin/warfarin, Pradaxa/dabigatran, aspirin)     no 9. OTHER SYMPTOMS: "Do you have any other symptoms?"  (e.g., abdominal pain, vomiting, dizziness, fever)     no 10. PREGNANCY: "Is there any chance you are pregnant?" "When was your last menstrual period?"       no  Protocols used: RECTAL BLEEDING-A-AH

## 2019-03-15 NOTE — Assessment & Plan Note (Signed)
Ok for mild topical steroid cream asd,  to f/u any worsening symptoms or concerns

## 2019-03-15 NOTE — Patient Instructions (Signed)
Please take all new medication as prescribed - the cream for the hemorrhoid, and the cream for the face as needed  Please continue all other medications as before, and refills have been done if requested.  Please have the pharmacy call with any other refills you may need.  Please continue your efforts at being more active, low cholesterol diet, and weight control.  You are otherwise up to date with prevention measures today.  Please keep your appointments with your specialists as you may have planned  You will be contacted regarding the referral for: colonoscopy  Please go to the LAB in the Basement (turn left off the elevator) for the tests to be done next Tuesday May 26  I will ask the office to call to arrange a follow up Doxy visit on Wed or Thur next week

## 2019-03-15 NOTE — Assessment & Plan Note (Signed)
stable overall by history and exam, recent data reviewed with pt, and pt to continue medical treatment as before,  to f/u any worsening symptoms or concerns  

## 2019-03-15 NOTE — Assessment & Plan Note (Signed)
wth some situaitonal worsening, declines counseling referral or change in tx

## 2019-03-15 NOTE — Telephone Encounter (Signed)
NOTED

## 2019-03-15 NOTE — Assessment & Plan Note (Signed)
For f/u lab, may need further replacement

## 2019-03-15 NOTE — Assessment & Plan Note (Signed)

## 2019-03-15 NOTE — Progress Notes (Signed)
Patient ID: ARANDA BIHM, female   DOB: 21-Aug-1951, 68 y.o.   MRN: [id number]  Virtual Visit via Video Note  I connected with Jennifer Andrade on 03/15/19 at  3:20 PM EDT by a video enabled telemedicine application and verified that I am speaking with the correct person using two identifiers.  Location: Patient: at home with husband next to her Provider: at office   I discussed the limitations of evaluation and management by telemedicine and the availability of in person appointments. The patient expressed understanding and agreed to proceed.  History of Present Illness: Here for wellness and f/u;  Overall doing ok;  Pt denies Chest pain, worsening SOB, DOE, wheezing, orthopnea, PND, worsening LE edema, palpitations, dizziness or syncope.  Pt denies neurological change such as new headache, facial or extremity weakness.  Pt denies polydipsia, polyuria, or low sugar symptoms. Pt states overall good compliance with treatment and medications, good tolerability, and has been trying to follow appropriate diet.  Pt denies worsening depressive symptoms, suicidal ideation or panic. No fever, night sweats, wt loss, loss of appetite, or other constitutional symptoms.  Pt states good ability with ADL's, has low fall risk, home safety reviewed and adequate, no other significant changes in hearing or vision, and only occasionally active with exercise. Denies worsening depressive symptoms, suicidal ideation, or panic; has ongoing anxiety, with some worsening recently as mother has COVID19 in a NH in [place].  Denies hyper or hypo thyroid symptoms such as voice, skin or hair change. Also c/o increased abd crampy pains with occasional nausea but no vomiting, fever, or constipation, but has diarrheal stools several times per day for 3 days. No recent wt loss or appetite change.  Seems to happen during times of more stress.   Also, Denies worsening reflux, abd pain, dysphagia, n/v, bowel chang, but has some anal discomfort  with sitting with small BRBPR x 3 days.   Also with scaly eczematous rash like areas to the face and distal legs several places for several months, somewhat itchy, no pain or swelling. Past Medical History:  Diagnosis Date  . ANEMIA-IRON DEFICIENCY 04/15/2008  . Cataract   . HYPERLIPIDEMIA 04/15/2008  . HYPERTENSION 04/15/2008  . HYPOTHYROIDISM 04/15/2008  . LUMBAR RADICULOPATHY, LEFT 11/24/2009  . Migraine 03/25/2011  . OSTEOPENIA 04/15/2008  . Other specified forms of hearing loss 09/30/2010  . SINUSITIS- ACUTE-NOS 09/30/2010  . Vitamin D deficiency 10/2011   level 16   Past Surgical History:  Procedure Laterality Date  . BACK SURGERY    . cataract surg     lens replacement Bil  . TUBAL LIGATION      reports that she quit smoking about 2 years ago. Her smoking use included cigarettes. She smoked 1.00 pack per day. She has never used smokeless tobacco. She reports current alcohol use. She reports that she does not use drugs. family history includes Heart disease in her father; Hypertension in her father; Kidney disease in her father; Thyroid disease in her mother. Allergies  Allergen Reactions  . Atorvastatin     REACTION: myalgias  . Rosuvastatin     REACTION: memory problem   Current Outpatient Medications on File Prior to Visit  Medication Sig Dispense Refill  . aspirin 81 MG EC tablet Take 81 mg by mouth daily.       No current facility-administered medications on file prior to visit.     Observations/Objective: Alert, NAD, appropriate mood and affect, resps normal, cn 2-12 intact, moves all 4s, no  visible rash or swelling Lab Results  Component Value Date   WBC 8.8 02/14/2017   WBC 8.7 02/14/2017   HGB 15.0 02/14/2017   HGB 15.0 02/14/2017   HCT 45.2 02/14/2017   HCT 44.9 02/14/2017   PLT 205.0 02/14/2017   PLT 210.0 02/14/2017   GLUCOSE 95 02/14/2017   CHOL 359 (H) 02/14/2017   TRIG 163.0 (H) 02/14/2017   HDL 55.40 02/14/2017   LDLDIRECT 195.2 03/22/2011   LDLCALC  271 (H) 02/14/2017   ALT 9 02/14/2017   AST 15 02/14/2017   NA 137 02/14/2017   K 4.8 02/14/2017   CL 101 02/14/2017   CREATININE 1.02 02/14/2017   BUN 23 02/14/2017   CO2 28 02/14/2017   TSH 6.83 (H) 02/14/2017   Assessment and Plan: See notes  Follow Up Instructions: See notes  I discussed the assessment and treatment plan with the patient. The patient was provided an opportunity to ask questions and all were answered. The patient agreed with the plan and demonstrated an understanding of the instructions.   The patient was advised to call back or seek an in-person evaluation if the symptoms worsen or if the condition fails to improve as anticipated.   Cathlean Cower, MD

## 2019-03-15 NOTE — Assessment & Plan Note (Addendum)
Mild active, for immodium prn, declines other such as bentyl  In addition to the time spent performing CPE, I spent an additional 25 minutes face to face,in which greater than 50% of this time was spent in counseling and coordination of care for patient's acute illness as documented, including the differential dx, treatment, further evaluation and other management of IBS, anxiety, hytpothyroid, vit d deficiency, hemtochezia, BRBPR

## 2019-03-19 ENCOUNTER — Other Ambulatory Visit (INDEPENDENT_AMBULATORY_CARE_PROVIDER_SITE_OTHER): Payer: Medicare HMO

## 2019-03-19 DIAGNOSIS — Z1159 Encounter for screening for other viral diseases: Secondary | ICD-10-CM

## 2019-03-19 DIAGNOSIS — E785 Hyperlipidemia, unspecified: Secondary | ICD-10-CM

## 2019-03-19 DIAGNOSIS — Z0001 Encounter for general adult medical examination with abnormal findings: Secondary | ICD-10-CM

## 2019-03-19 DIAGNOSIS — E538 Deficiency of other specified B group vitamins: Secondary | ICD-10-CM | POA: Diagnosis not present

## 2019-03-19 DIAGNOSIS — E611 Iron deficiency: Secondary | ICD-10-CM | POA: Diagnosis not present

## 2019-03-19 DIAGNOSIS — E559 Vitamin D deficiency, unspecified: Secondary | ICD-10-CM

## 2019-03-19 DIAGNOSIS — Z114 Encounter for screening for human immunodeficiency virus [HIV]: Secondary | ICD-10-CM

## 2019-03-19 DIAGNOSIS — R69 Illness, unspecified: Secondary | ICD-10-CM | POA: Diagnosis not present

## 2019-03-19 LAB — BASIC METABOLIC PANEL
BUN: 21 mg/dL (ref 6–23)
CO2: 29 mEq/L (ref 19–32)
Calcium: 9.8 mg/dL (ref 8.4–10.5)
Chloride: 104 mEq/L (ref 96–112)
Creatinine, Ser: 1.15 mg/dL (ref 0.40–1.20)
GFR: 47.16 mL/min — ABNORMAL LOW (ref >60.00)
Glucose, Bld: 102 mg/dL — ABNORMAL HIGH (ref 70–99)
Potassium: 4.3 mEq/L (ref 3.5–5.1)
Sodium: 142 mEq/L (ref 135–145)

## 2019-03-19 LAB — HEPATIC FUNCTION PANEL
ALT: 9 U/L (ref 0–35)
AST: 13 U/L (ref 0–37)
Albumin: 4.5 g/dL (ref 3.5–5.2)
Alkaline Phosphatase: 46 U/L (ref 39–117)
Bilirubin, Direct: 0.1 mg/dL (ref 0.0–0.3)
Total Bilirubin: 0.5 mg/dL (ref 0.2–1.2)
Total Protein: 7.4 g/dL (ref 6.0–8.3)

## 2019-03-19 LAB — CBC WITH DIFFERENTIAL/PLATELET
Basophils Absolute: 0 10*3/uL (ref 0.0–0.1)
Basophils Relative: 0.6 % (ref 0.0–3.0)
Eosinophils Absolute: 0.1 10*3/uL (ref 0.0–0.7)
Eosinophils Relative: 2.2 % (ref 0.0–5.0)
HCT: 40.3 % (ref 36.0–46.0)
Hemoglobin: 13.7 g/dL (ref 12.0–15.0)
Lymphocytes Relative: 31.8 % (ref 12.0–46.0)
Lymphs Abs: 1.8 10*3/uL (ref 0.7–4.0)
MCHC: 34 g/dL (ref 30.0–36.0)
MCV: 92.1 fl (ref 78.0–100.0)
Monocytes Absolute: 0.6 10*3/uL (ref 0.1–1.0)
Monocytes Relative: 11.4 % (ref 3.0–12.0)
Neutro Abs: 3.1 10*3/uL (ref 1.4–7.7)
Neutrophils Relative %: 54 % (ref 43.0–77.0)
Platelets: 180 10*3/uL (ref 150.0–400.0)
RBC: 4.37 Mil/uL (ref 3.87–5.11)
RDW: 13.3 % (ref 11.5–15.5)
WBC: 5.6 10*3/uL (ref 4.0–10.5)

## 2019-03-19 LAB — VITAMIN B12: Vitamin B-12: 260 pg/mL (ref 211–911)

## 2019-03-19 LAB — LIPID PANEL
Cholesterol: 362 mg/dL — ABNORMAL HIGH (ref 0–200)
HDL: 48 mg/dL (ref >39.00)
LDL Cholesterol: 285 mg/dL — ABNORMAL HIGH (ref 0–99)
NonHDL: 314.11
Total CHOL/HDL Ratio: 8
Triglycerides: 147 mg/dL (ref 0.0–149.0)
VLDL: 29.4 mg/dL (ref 0.0–40.0)

## 2019-03-19 LAB — IBC PANEL
Iron: 152 ug/dL — ABNORMAL HIGH (ref 42–145)
Saturation Ratios: 44.1 % (ref 20.0–50.0)
Transferrin: 246 mg/dL (ref 212.0–360.0)

## 2019-03-19 LAB — VITAMIN D 25 HYDROXY (VIT D DEFICIENCY, FRACTURES): VITD: 11.25 ng/mL — ABNORMAL LOW (ref 30.00–100.00)

## 2019-03-19 LAB — TSH: TSH: 0.78 u[IU]/mL (ref 0.35–4.50)

## 2019-03-20 ENCOUNTER — Other Ambulatory Visit: Payer: Self-pay | Admitting: Internal Medicine

## 2019-03-20 LAB — HIV ANTIBODY (ROUTINE TESTING W REFLEX): HIV 1&2 Ab, 4th Generation: NONREACTIVE

## 2019-03-20 LAB — HEPATITIS C ANTIBODY
Hepatitis C Ab: NONREACTIVE
SIGNAL TO CUT-OFF: 0.04 (ref <1.00)

## 2019-03-20 MED ORDER — VITAMIN D (ERGOCALCIFEROL) 1.25 MG (50000 UNIT) PO CAPS: 50000.0000 [IU] | ORAL_CAPSULE | ORAL | 0 refills | Status: DC

## 2019-03-21 ENCOUNTER — Ambulatory Visit (INDEPENDENT_AMBULATORY_CARE_PROVIDER_SITE_OTHER): Payer: Medicare HMO | Admitting: Internal Medicine

## 2019-03-21 ENCOUNTER — Encounter: Payer: Self-pay | Admitting: Internal Medicine

## 2019-03-21 DIAGNOSIS — E039 Hypothyroidism, unspecified: Secondary | ICD-10-CM | POA: Diagnosis not present

## 2019-03-21 DIAGNOSIS — N183 Chronic kidney disease, stage 3 unspecified: Secondary | ICD-10-CM

## 2019-03-21 DIAGNOSIS — E785 Hyperlipidemia, unspecified: Secondary | ICD-10-CM

## 2019-03-21 DIAGNOSIS — E559 Vitamin D deficiency, unspecified: Secondary | ICD-10-CM | POA: Diagnosis not present

## 2019-03-21 DIAGNOSIS — E538 Deficiency of other specified B group vitamins: Secondary | ICD-10-CM | POA: Diagnosis not present

## 2019-03-21 HISTORY — DX: Chronic kidney disease, stage 3 unspecified: N18.30

## 2019-03-21 MED ORDER — EZETIMIBE 10 MG PO TABS: 10.0000 mg | ORAL_TABLET | Freq: Every day | ORAL | 3 refills | Status: DC

## 2019-03-21 MED ORDER — METOPROLOL TARTRATE 25 MG PO TABS: 25.0000 mg | ORAL_TABLET | Freq: Two times a day (BID) | ORAL | 1 refills | Status: DC

## 2019-03-21 MED ORDER — CANDESARTAN CILEXETIL 32 MG PO TABS: ORAL_TABLET | ORAL | 1 refills | Status: DC

## 2019-03-21 NOTE — Patient Instructions (Signed)
Please take all new medication as prescribed - the zetia 10 mg per day, and the Vit D prescription  Please also start oral OTC B12 vitamin - 1 per day   Please continue all other medications as before, and refills have been done if requested.  Please have the pharmacy call with any other refills you may need.  Please continue your efforts at being more active, low cholesterol diet, and weight control.  Please keep your appointments with your specialists as you may have planned  Please return in 6 months, or sooner if needed, with Lab testing done 3-5 days before

## 2019-03-21 NOTE — Progress Notes (Signed)
Patient ID: Jennifer Andrade, female   DOB: 07-05-1951, 68 y.o.   MRN: [id number]  Virtual Visit via Video Note  I connected with Jennifer Andrade on 03/21/19 at  9:00 AM EDT by a video enabled telemedicine application and verified that I am speaking with the correct person using two identifiers.  Location: Patient: at home Provider: at office   I discussed the limitations of evaluation and management by telemedicine and the availability of in person appointments. The patient expressed understanding and agreed to proceed.  History of Present Illness: Here to f/u; overall doing ok,  Pt denies chest pain, increasing sob or doe, wheezing, orthopnea, PND, increased LE swelling, palpitations, dizziness or syncope.  Pt denies new neurological symptoms such as new headache, or facial or extremity weakness or numbness.  Pt denies polydipsia, polyuria, or low sugar episode.  Pt states overall good compliance with meds, mostly trying to follow appropriate diet, with wt overall stable,  but little exercise however.  No new complaints, asks to review labs today Past Medical History:  Diagnosis Date  . ANEMIA-IRON DEFICIENCY 04/15/2008  . Cataract   . CKD (chronic kidney disease) stage 3, GFR 30-59 ml/min (HCC) 03/21/2019  . HYPERLIPIDEMIA 04/15/2008  . HYPERTENSION 04/15/2008  . HYPOTHYROIDISM 04/15/2008  . LUMBAR RADICULOPATHY, LEFT 11/24/2009  . Migraine 03/25/2011  . OSTEOPENIA 04/15/2008  . Other specified forms of hearing loss 09/30/2010  . SINUSITIS- ACUTE-NOS 09/30/2010  . Vitamin D deficiency 10/2011   level 16   Past Surgical History:  Procedure Laterality Date  . BACK SURGERY    . cataract surg     lens replacement Bil  . TUBAL LIGATION      reports that she quit smoking about 2 years ago. Her smoking use included cigarettes. She smoked 1.00 pack per day. She has never used smokeless tobacco. She reports current alcohol use. She reports that she does not use drugs. family history includes Heart  disease in her father; Hypertension in her father; Kidney disease in her father; Thyroid disease in her mother. Allergies  Allergen Reactions  . Atorvastatin     REACTION: myalgias  . Rosuvastatin     REACTION: memory problem   Current Outpatient Medications on File Prior to Visit  Medication Sig Dispense Refill  . aspirin 81 MG EC tablet Take 81 mg by mouth daily.      . hydrocortisone (ANUSOL-HC) 2.5 % rectal cream Place 1 application rectally 2 (two) times daily. 30 g 0  . levothyroxine (SYNTHROID) 75 MCG tablet Take 1 tablet (75 mcg total) by mouth daily. 90 tablet 3  . triamcinolone cream (KENALOG) 0.1 % Apply 1 application topically 2 (two) times daily. 30 g 2  . Vitamin D, Ergocalciferol, (DRISDOL) 1.25 MG (50000 UT) CAPS capsule Take 1 capsule (50,000 Units total) by mouth every 7 (seven) days. 12 capsule 0   No current facility-administered medications on file prior to visit.     Observations/Objective: Alert, NAD, appropriate mood and affect, resps normal, cn 2-12 intact, moves all 4s, no visible rash or swelling Lab Results  Component Value Date   WBC 5.6 03/19/2019   HGB 13.7 03/19/2019   HCT 40.3 03/19/2019   PLT 180.0 03/19/2019   GLUCOSE 102 (H) 03/19/2019   CHOL 362 (H) 03/19/2019   TRIG 147.0 03/19/2019   HDL 48.00 03/19/2019   LDLDIRECT 195.2 03/22/2011   LDLCALC 285 (H) 03/19/2019   ALT 9 03/19/2019   AST 13 03/19/2019   NA 142 03/19/2019  K 4.3 03/19/2019   CL 104 03/19/2019   CREATININE 1.15 03/19/2019   BUN 21 03/19/2019   CO2 29 03/19/2019   TSH 0.78 03/19/2019   Assessment and Plan: See notes  Follow Up Instructions: See notes   I discussed the assessment and treatment plan with the patient. The patient was provided an opportunity to ask questions and all were answered. The patient agreed with the plan and demonstrated an understanding of the instructions.   The patient was advised to call back or seek an in-person evaluation if the symptoms  worsen or if the condition fails to improve as anticipated.  Cathlean Cower, MD

## 2019-03-23 ENCOUNTER — Encounter: Payer: Self-pay | Admitting: Internal Medicine

## 2019-03-23 DIAGNOSIS — E538 Deficiency of other specified B group vitamins: Secondary | ICD-10-CM | POA: Insufficient documentation

## 2019-03-23 NOTE — Assessment & Plan Note (Signed)
Severe, statin intolerant, to add zetia 10 qd, o/w stable overall by history and exam, recent data reviewed with pt, and pt to continue medical treatment as before,  to f/u any worsening symptoms or concerns

## 2019-03-23 NOTE — Assessment & Plan Note (Signed)
stable overall by history and exam, recent data reviewed with pt, and pt to continue medical treatment as before,  to f/u any worsening symptoms or concerns  

## 2019-03-23 NOTE — Assessment & Plan Note (Signed)
D/w pt, tx with Vit D 50000 units weekly for 12 wks, then 2000 units per day

## 2019-03-23 NOTE — Assessment & Plan Note (Signed)
Borderline low, for oral b12 daily

## 2019-04-15 ENCOUNTER — Encounter: Payer: Medicare HMO | Admitting: Internal Medicine

## 2019-06-06 ENCOUNTER — Other Ambulatory Visit: Payer: Self-pay | Admitting: Internal Medicine

## 2019-10-12 ENCOUNTER — Other Ambulatory Visit: Payer: Self-pay | Admitting: Internal Medicine

## 2020-02-26 ENCOUNTER — Other Ambulatory Visit: Payer: Self-pay | Admitting: Internal Medicine

## 2020-02-26 NOTE — Telephone Encounter (Signed)
Please refill as per office routine med refill policy (all routine meds refilled for 3 mo or monthly per pt preference up to one year from last visit, then month to month grace period for 3 mo, then further med refills will have to be denied)  

## 2020-03-12 ENCOUNTER — Other Ambulatory Visit: Payer: Self-pay

## 2020-03-12 ENCOUNTER — Ambulatory Visit (INDEPENDENT_AMBULATORY_CARE_PROVIDER_SITE_OTHER): Payer: Medicare HMO | Admitting: Internal Medicine

## 2020-03-12 ENCOUNTER — Telehealth: Payer: Self-pay

## 2020-03-12 VITALS — BP 150/100 | HR 74 | Temp 98.2°F | Ht 67.0 in | Wt 120.0 lb

## 2020-03-12 DIAGNOSIS — R739 Hyperglycemia, unspecified: Secondary | ICD-10-CM

## 2020-03-12 DIAGNOSIS — Z0001 Encounter for general adult medical examination with abnormal findings: Secondary | ICD-10-CM

## 2020-03-12 DIAGNOSIS — E785 Hyperlipidemia, unspecified: Secondary | ICD-10-CM | POA: Diagnosis not present

## 2020-03-12 DIAGNOSIS — I10 Essential (primary) hypertension: Secondary | ICD-10-CM

## 2020-03-12 DIAGNOSIS — L309 Dermatitis, unspecified: Secondary | ICD-10-CM

## 2020-03-12 DIAGNOSIS — E559 Vitamin D deficiency, unspecified: Secondary | ICD-10-CM

## 2020-03-12 DIAGNOSIS — E538 Deficiency of other specified B group vitamins: Secondary | ICD-10-CM | POA: Diagnosis not present

## 2020-03-12 DIAGNOSIS — N183 Chronic kidney disease, stage 3 unspecified: Secondary | ICD-10-CM

## 2020-03-12 MED ORDER — TRIAMCINOLONE ACETONIDE 0.1 % EX CREA: 1.0000 "application " | TOPICAL_CREAM | Freq: Two times a day (BID) | CUTANEOUS | 2 refills | Status: DC

## 2020-03-12 MED ORDER — AMLODIPINE BESYLATE 5 MG PO TABS
5.0000 mg | ORAL_TABLET | Freq: Every day | ORAL | 3 refills | Status: DC
Start: 2020-03-12 — End: 2020-08-12

## 2020-03-12 NOTE — Telephone Encounter (Signed)
New message   Seen in the office today was told to go downstairs to have drawn labs.   Per verbalized the lab tech was unable to find a vein the lab tech asked the patient did she drink water today.   The patient voiced the lab technique was digging around in her arm and very painful.   The patient got up and walked out.   Wants to know what's her next steps to getting her labs drawn.

## 2020-03-12 NOTE — Patient Instructions (Signed)
Please take all new medication as prescribed - the amlodipine 5 mg per day for blood pressure, and the steroid cream  Please continue all other medications as before, and refills have been done if requested.  Please have the pharmacy call with any other refills you may need.  Please continue your efforts at being more active, low cholesterol diet, and weight control.  You are otherwise up to date with prevention measures today.  Please keep your appointments with your specialists as you may have planned  Please go to the LAB at the blood drawing area for the tests to be done  You will be contacted by phone if any changes need to be made immediately.  Otherwise, you will receive a letter about your results with an explanation, but please check with MyChart first.  Please remember to sign up for MyChart if you have not done so, as this will be important to you in the future with finding out test results, communicating by private email, and scheduling acute appointments online when needed.  Please make an Appointment to return in 6 months, or sooner if needed, also with Lab Appointment for testing done 3-5 days before at the [facility] (so this is for TWO appointments - please see the scheduling desk as you leave)

## 2020-03-12 NOTE — Progress Notes (Signed)
Subjective:    Patient ID: Jennifer Andrade, female    DOB: 1951/09/03, 69 y.o.   MRN: [id number]  HPI  Here for wellness and f/u;  Overall doing ok;  Pt denies Chest pain, worsening SOB, DOE, wheezing, orthopnea, PND, worsening LE edema, palpitations, dizziness or syncope.  Pt denies neurological change such as new headache, facial or extremity weakness.  Pt denies polydipsia, polyuria, or low sugar symptoms. Pt states overall good compliance with treatment and medications, good tolerability, and has been trying to follow appropriate diet.  Pt denies worsening depressive symptoms, suicidal ideation or panic. No fever, night sweats, wt loss, loss of appetite, or other constitutional symptoms.  Pt states good ability with ADL's, has low fall risk, home safety reviewed and adequate, no other significant changes in hearing or vision, and only occasionally active with exercise. Also with c/o worsening eczema for several wks multiple areas to the extremities with marked itching.  Past Medical History:  Diagnosis Date  . ANEMIA-IRON DEFICIENCY 04/15/2008  . Cataract   . CKD (chronic kidney disease) stage 3, GFR 30-59 ml/min (HCC) 03/21/2019  . HYPERLIPIDEMIA 04/15/2008  . HYPERTENSION 04/15/2008  . HYPOTHYROIDISM 04/15/2008  . LUMBAR RADICULOPATHY, LEFT 11/24/2009  . Migraine 03/25/2011  . OSTEOPENIA 04/15/2008  . Other specified forms of hearing loss 09/30/2010  . SINUSITIS- ACUTE-NOS 09/30/2010  . Vitamin D deficiency 10/2011   level 16   Past Surgical History:  Procedure Laterality Date  . BACK SURGERY    . cataract surg     lens replacement Bil  . TUBAL LIGATION      reports that she quit smoking about 3 years ago. Her smoking use included cigarettes. She smoked 1.00 pack per day. She has never used smokeless tobacco. She reports current alcohol use. She reports that she does not use drugs. family history includes Heart disease in her father; Hypertension in her father; Kidney disease in her father;  Thyroid disease in her mother. Allergies  Allergen Reactions  . Atorvastatin     REACTION: myalgias  . Rosuvastatin     REACTION: memory problem   Current Outpatient Medications on File Prior to Visit  Medication Sig Dispense Refill  . aspirin 81 MG EC tablet Take 81 mg by mouth daily.      . candesartan (ATACAND) 32 MG tablet 1 tab by mouth daily 90 tablet 1  . ezetimibe (ZETIA) 10 MG tablet Take 1 tablet (10 mg total) by mouth daily. 90 tablet 3  . hydrocortisone (ANUSOL-HC) 2.5 % rectal cream Place 1 application rectally 2 (two) times daily. 30 g 0  . levothyroxine (SYNTHROID) 75 MCG tablet Take 1 tablet (75 mcg total) by mouth daily. Annual appt is due must see provider for future refills 30 tablet 0  . metoprolol tartrate (LOPRESSOR) 25 MG tablet TAKE 1 TABLET BY MOUTH TWICE A DAY 180 tablet 1  . Vitamin D, Ergocalciferol, (DRISDOL) 1.25 MG (50000 UT) CAPS capsule Take 1 capsule (50,000 Units total) by mouth every 7 (seven) days. 12 capsule 0   No current facility-administered medications on file prior to visit.   Review of Systems All otherwise neg per pt    Objective:   Physical Exam BP (!) 150/100 (BP Location: Left Arm, Patient Position: Sitting, Cuff Size: Large)   Pulse 74   Temp 98.2 F (36.8 C) (Oral)   Ht 5\' 7"  (1.702 m)   Wt 120 lb (54.4 kg)   LMP 10/24/2001   SpO2 99%   BMI  18.79 kg/m  VS noted,  Constitutional: Pt appears in NAD HENT: Head: NCAT.  Right Ear: External ear normal.  Left Ear: External ear normal.  Eyes: . Pupils are equal, round, and reactive to light. Conjunctivae and EOM are normal Nose: without d/c or deformity Neck: Neck supple. Gross normal ROM Cardiovascular: Normal rate and regular rhythm.   Pulmonary/Chest: Effort normal and breath sounds without rales or wheezing.  Abd:  Soft, NT, ND, + BS, no organomegaly Neurological: Pt is alert. At baseline orientation, motor grossly intact Skin: Skin is warm. No rashes, other new lesions, no  LE edema Psychiatric: Pt behavior is normal without agitation  All otherwise neg per pt Lab Results  Component Value Date   WBC 5.6 03/19/2019   HGB 13.7 03/19/2019   HCT 40.3 03/19/2019   PLT 180.0 03/19/2019   GLUCOSE 102 (H) 03/19/2019   CHOL 362 (H) 03/19/2019   TRIG 147.0 03/19/2019   HDL 48.00 03/19/2019   LDLDIRECT 195.2 03/22/2011   LDLCALC 285 (H) 03/19/2019   ALT 9 03/19/2019   AST 13 03/19/2019   NA 142 03/19/2019   K 4.3 03/19/2019   CL 104 03/19/2019   CREATININE 1.15 03/19/2019   BUN 21 03/19/2019   CO2 29 03/19/2019   TSH 0.78 03/19/2019      Assessment & Plan:

## 2020-03-14 ENCOUNTER — Encounter: Payer: Self-pay | Admitting: Internal Medicine

## 2020-03-14 NOTE — Assessment & Plan Note (Signed)
For f/u lab, cont oral replacement 

## 2020-03-14 NOTE — Assessment & Plan Note (Addendum)
Mild to mod, for steroid cream prn, to f/u any worsening symptoms or concerns  I spent 31 minutes in preparing to see the patient by review of recent labs, imaging and procedures, obtaining and reviewing separately obtained history, communicating with the patient and family or caregiver, ordering medications, tests or procedures, and documenting clinical information in the EHR including the differential Dx, treatment, and any further evaluation and other management of eczema, vit d def, hld, htn, ckd, b12 deficiency

## 2020-03-14 NOTE — Assessment & Plan Note (Signed)

## 2020-03-14 NOTE — Assessment & Plan Note (Signed)
Uncontrolled, to add amlodipine 5 qd

## 2020-03-14 NOTE — Assessment & Plan Note (Signed)
Uncontrolled, to refer lipid clinic

## 2020-03-14 NOTE — Assessment & Plan Note (Signed)
For f/u lab, oral replaacement

## 2020-03-14 NOTE — Assessment & Plan Note (Signed)
stable overall by history and exam, recent data reviewed with pt, and pt to continue medical treatment as before,  to f/u any worsening symptoms or concerns, f/u lab 6 mo

## 2020-03-16 NOTE — Telephone Encounter (Signed)
Pt states she will go to [facility] for her labs to be drawn. Pt states if that does not work she will look into going somewhere else for her blood work to be done.

## 2020-03-18 ENCOUNTER — Other Ambulatory Visit: Payer: Self-pay | Admitting: Internal Medicine

## 2020-03-25 ENCOUNTER — Other Ambulatory Visit: Payer: Medicare HMO

## 2020-03-25 DIAGNOSIS — E559 Vitamin D deficiency, unspecified: Secondary | ICD-10-CM

## 2020-03-25 DIAGNOSIS — N183 Chronic kidney disease, stage 3 unspecified: Secondary | ICD-10-CM

## 2020-03-25 DIAGNOSIS — Z0001 Encounter for general adult medical examination with abnormal findings: Secondary | ICD-10-CM

## 2020-03-25 DIAGNOSIS — E785 Hyperlipidemia, unspecified: Secondary | ICD-10-CM

## 2020-03-25 DIAGNOSIS — E538 Deficiency of other specified B group vitamins: Secondary | ICD-10-CM

## 2020-03-25 DIAGNOSIS — R739 Hyperglycemia, unspecified: Secondary | ICD-10-CM

## 2020-04-05 ENCOUNTER — Other Ambulatory Visit: Payer: Self-pay | Admitting: Internal Medicine

## 2020-04-05 NOTE — Telephone Encounter (Signed)
Please refill as per office routine med refill policy (all routine meds refilled for 3 mo or monthly per pt preference up to one year from last visit, then month to month grace period for 3 mo, then further med refills will have to be denied)  

## 2020-05-05 ENCOUNTER — Telehealth: Payer: Self-pay | Admitting: Internal Medicine

## 2020-05-05 ENCOUNTER — Encounter: Payer: Self-pay | Admitting: Internal Medicine

## 2020-05-05 ENCOUNTER — Other Ambulatory Visit: Payer: Self-pay

## 2020-05-05 ENCOUNTER — Ambulatory Visit: Payer: Medicare HMO | Admitting: Internal Medicine

## 2020-05-05 VITALS — BP 142/88 | HR 63 | Ht 67.0 in | Wt 118.8 lb

## 2020-05-05 DIAGNOSIS — T466X5A Adverse effect of antihyperlipidemic and antiarteriosclerotic drugs, initial encounter: Secondary | ICD-10-CM | POA: Diagnosis not present

## 2020-05-05 DIAGNOSIS — M791 Myalgia, unspecified site: Secondary | ICD-10-CM | POA: Diagnosis not present

## 2020-05-05 DIAGNOSIS — I779 Disorder of arteries and arterioles, unspecified: Secondary | ICD-10-CM | POA: Diagnosis not present

## 2020-05-05 DIAGNOSIS — E7849 Other hyperlipidemia: Secondary | ICD-10-CM

## 2020-05-05 NOTE — Progress Notes (Addendum)
LIPID CLINIC CONSULT NOTE  Chief Complaint:  Manage dyslipidemia  Primary Care Physician: Jennifer Borg, MD  Primary Cardiologist:  No primary care provider on file.  HPI:  Jennifer Andrade is a 69 y.o. female who is being seen today for the evaluation of dyslipidemia at the request of Jennifer Borg, MD.  This is a 69 year old female kindly referred by Dr. Jenny Andrade for evaluation management of marked dyslipidemia.  Her recent lipid profile showed total cholesterol 362, HDL 48, LDL 285 and triglycerides 147.  She reports longstanding elevated cholesterol despite eating very little as far as unhealthy foods or high saturated fats.  Her BMI is only 18.  There is a strong family history of heart disease including her father who has had 3 MIs and had CABG starting in his 18s.  She also has 2 half-sisters who have coronary disease.  In addition she has known ASCVD with mild to moderate bilateral carotid artery disease.  Fortunately, she cannot tolerate statins due to significant myalgias and mental status changes.  Other medical problems include hypertension, hypothyroidism and stage III chronic kidney disease.  PMHx:  Past Medical History:  Diagnosis Date  . ANEMIA-IRON DEFICIENCY 04/15/2008  . Cataract   . CKD (chronic kidney disease) stage 3, GFR 30-59 ml/min 03/21/2019  . HYPERLIPIDEMIA 04/15/2008  . HYPERTENSION 04/15/2008  . HYPOTHYROIDISM 04/15/2008  . LUMBAR RADICULOPATHY, LEFT 11/24/2009  . Migraine 03/25/2011  . OSTEOPENIA 04/15/2008  . Other specified forms of hearing loss 09/30/2010  . SINUSITIS- ACUTE-NOS 09/30/2010  . Vitamin D deficiency 10/2011   level 16    Past Surgical History:  Procedure Laterality Date  . BACK SURGERY    . cataract surg     lens replacement Bil  . TUBAL LIGATION      FAMHx:  Family History  Problem Relation Age of Onset  . Heart disease Father   . Kidney disease Father   . Hypertension Father   . Thyroid disease Mother     SOCHx:   reports that  she quit smoking about 3 years ago. Her smoking use included cigarettes. She smoked 1.00 pack per day. She has never used smokeless tobacco. She reports current alcohol use. She reports that she does not use drugs.  ALLERGIES:  Allergies  Allergen Reactions  . Atorvastatin     REACTION: myalgias  . Rosuvastatin     REACTION: memory problem    ROS: Pertinent items noted in HPI and remainder of comprehensive ROS otherwise negative.  HOME MEDS: Current Outpatient Medications on File Prior to Visit  Medication Sig Dispense Refill  . candesartan (ATACAND) 32 MG tablet TAKE 1 TABLET BY MOUTH EVERY DAY 90 tablet 3  . levothyroxine (SYNTHROID) 75 MCG tablet Take 1 tablet (75 mcg total) by mouth daily. Annual appt is due must see provider for future refills 30 tablet 0  . metoprolol tartrate (LOPRESSOR) 25 MG tablet Take 25 mg by mouth daily.    Marland Kitchen amLODipine (NORVASC) 5 MG tablet Take 1 tablet (5 mg total) by mouth daily. (Patient not taking: Reported on 05/05/2020) 90 tablet 3   No current facility-administered medications on file prior to visit.    LABS/IMAGING: No results found for this or any previous visit (from the past 48 hour(s)). No results found.  LIPID PANEL:    Component Value Date/Time   CHOL 362 (H) 03/19/2019 1001   TRIG 147.0 03/19/2019 1001   HDL 48.00 03/19/2019 1001   CHOLHDL 8 03/19/2019 1001  VLDL 29.4 03/19/2019 1001   LDLCALC 285 (H) 03/19/2019 1001   LDLDIRECT 195.2 03/22/2011 0759    WEIGHTS: Wt Readings from Last 3 Encounters:  05/05/20 118 lb 12.8 oz (53.9 kg)  03/12/20 120 lb (54.4 kg)  04/10/18 131 lb (59.4 kg)    VITALS: BP (!) 142/88   Pulse 63   Ht 5\' 7"  (1.702 m)   Wt 118 lb 12.8 oz (53.9 kg)   LMP 10/24/2001   SpO2 98%   BMI 18.61 kg/m   EXAM: General appearance: alert and no distress Neck: no carotid bruit, no JVD and thyroid not enlarged, symmetric, no tenderness/mass/nodules Lungs: clear to auscultation bilaterally Heart:  regular rate and rhythm, S1, S2 normal and systolic murmur: early systolic 2/6, crescendo at 2nd right intercostal space Abdomen: soft, non-tender; bowel sounds normal; no masses,  no organomegaly and scaphoid Extremities: extremities normal, atraumatic, no cyanosis or edema Pulses: 2+ and symmetric Skin: Skin color, texture, turgor normal. No rashes or lesions Neurologic: Grossly normal : Pleasant  EKG: Deferred  ASSESSMENT: 1. Probable familial hyperlipidemia - Dutch score of 7 2. ASCVD-bilateral carotid artery disease 3. CKD 3 4. Intolerance-myalgias 5. Family history of premature onset coronary disease in father  PLAN: 46.   Jennifer Andrade has probable familial hyperlipidemia and statin intolerance.  She has bilateral carotid artery disease and likely underlying coronary artery disease, is not had any early onset events.  She needs aggressive lipid-lowering and I would recommend a PCSK9 inhibitor.  Unfortunately she cannot tolerate statins.  She will likely need additional therapies to try to target LDL less than 70 although may not reach that given her high LDL.  We discussed testing for familial hyperlipidemia however she declined due to concerns about the cost and the fact that she is quite certain that she has a genetic dyslipidemia.  We will also test an LP(a) in addition to her labs in about 3 months after starting therapy.  She can follow-up with me at that time.  Thanks again for the kind referral.  Pixie Casino, MD, FACC, [place] Director of the [facility] Diplomate of the American Board of Clinical Lipidology Attending Cardiologist  Direct Dial: [phone]  Fax: [phone]  Website:  [email or web address] Selenia Mihok 05/05/2020, 10:17 AM

## 2020-05-05 NOTE — Telephone Encounter (Signed)
PRALUENT Soln Auto-inj Type of coverage approved: Prior Authorization This approval authorizes your coverage from 10/25/2019 - 10/23/2020, unless we notify you  otherwise, and as long as the following conditions apply: . you remain enrolled in our Medicare Part D prescription drug plan, . your physician or other prescriber continues to prescribe the medication for you, and . the medication continues to be safe for treating your condition.  Patient notified via [place]

## 2020-05-05 NOTE — Telephone Encounter (Signed)
PA for praluent 150mg /mL submitted via CMM (Key: [id number])

## 2020-05-05 NOTE — Patient Instructions (Signed)
Medication Instructions:  Dr. Debara Pickett recommends Repatha 153m/mL or Praluent 150mg /mL (PCSK9). This is an injectable cholesterol medication self-administered once every 14 days. This medication will likely need prior approval with your insurance company, which we will work on. If the medication is not approved initially, we may need to do an appeal with your insurance. We will keep you updated on this process.   Administer medication in area of fatty tissue such as abdomen, outer thigh, back up of arm - and rotate site with each injection Store medication in refrigerator until ready to administer - allow to sit at room temp for 30 mins - 1 hour prior to injection Dispose of medication in a SHARPS container - your pharmacy should be able to direct you on this and proper disposal   If you need co-pay assistance grant, please look into the program at healthwellfoundation.org >> disease funds >> hypercholesterolemia. This is an online application or you can call to complete. Once approved, you will provide the "pharmacy card" information to your pharmacy and they will deduct the co-pays from this grant.  If you need a co-pay card for Repatha: [email or web address] >> paying for Repatha or red box that says "Repatha Copay Card" in top right If you need a co-pay card for Praluent: WedMap.it >> starting & paying for Praluent  *If you need a refill on your cardiac medications before your next appointment, please call your pharmacy*   Lab Work: FASTING lab work in 3-4 months to check cholesterol & LP(a)  If you have labs (blood work) drawn today and your tests are completely normal, you will receive your results only by:  [place] (if you have MyChart) OR  A paper copy in the mail If you have any lab test that is abnormal or we need to change your treatment, we will call you to review the results.   Testing/Procedures: NONE   Follow-Up: At [facility], you and your health needs are our  priority.  As part of our continuing mission to provide you with exceptional heart care, we have created designated Provider Care Teams.  These Care Teams include your primary Cardiologist (physician) and Advanced Practice Providers (APPs -  Physician Assistants and Nurse Practitioners) who all work together to provide you with the care you need, when you need it.  We recommend signing up for the patient portal called "MyChart".  Sign up information is provided on this After Visit Summary.  MyChart is used to connect with patients for Virtual Visits (Telemedicine).  Patients are able to view lab/test results, encounter notes, upcoming appointments, etc.  Non-urgent messages can be sent to your provider as well.   To learn more about what you can do with MyChart, go to [email or web address].    Your next appointment:   3 month(s)  The format for your next appointment:   Either In Person or Virtual  Provider:   Raliegh Ip Mali Hilty, MD   Other Instructions

## 2020-05-12 ENCOUNTER — Telehealth: Payer: Self-pay | Admitting: Internal Medicine

## 2020-05-12 MED ORDER — PRALUENT 150 MG/ML ~~LOC~~ SOAJ: 1.0000 | SUBCUTANEOUS | 11 refills | Status: DC

## 2020-05-12 NOTE — Telephone Encounter (Signed)
Patient called and notified med has been approved. She plans to apply for healthwellfoundation grant. Rx(s) sent to pharmacy electronically.

## 2020-05-12 NOTE — Telephone Encounter (Signed)
Pt c/o medication issue:  1. Name of Medication: Alirocumab (PRALUENT) 150 MG/ML SOAJ  2. How are you currently taking this medication (dosage and times per day)? Pt has not started using this yet   3. Are you having a reaction (difficulty breathing--STAT)? No   4. What is your medication issue? Price issue  Husband of the patient called. The pharmacy was hoping Dr. Debara Pickett could submit paperwork to change coverage tiers for this medication so it is more affordable. [employer] recommends that Dr. Debara Pickett state that the medication does not have an alternative and it is medically necessary for the patient.  Holland Falling gave the Husband a fax number of [phone] to fax any forms to.  Holland Falling can be reached by phone at [phone]

## 2020-05-12 NOTE — Addendum Note (Signed)
Addended by: Fidel Levy on: 05/12/2020 09:55 AM   Modules accepted: Orders

## 2020-05-13 NOTE — Telephone Encounter (Signed)
Spoke with Holland Falling and b/c Praluent is a specialty med, no tier exception would be approved. Patient aware of this and she also states she was denied a grant from [employer] d/t income. She said her co-pay $47/month and that is OK, but she will hit the donut hole. Advised we can offer samples every so often to help with cost.

## 2020-05-20 ENCOUNTER — Other Ambulatory Visit: Payer: Self-pay | Admitting: Internal Medicine

## 2020-05-20 MED ORDER — METOPROLOL SUCCINATE ER 50 MG PO TB24: 50.0000 mg | ORAL_TABLET | Freq: Every day | ORAL | 2 refills | Status: DC

## 2020-05-20 NOTE — Telephone Encounter (Signed)
Please refill as per office routine med refill policy (all routine meds refilled for 3 mo or monthly per pt preference up to one year from last visit, then month to month grace period for 3 mo, then further med refills will have to be denied)  

## 2020-05-20 NOTE — Telephone Encounter (Signed)
Ok this is done 

## 2020-05-20 NOTE — Telephone Encounter (Signed)
Patient is calling to follow up in regards to medication. She states she is requesting to speak with Dr. Lysbeth Penner nurse to discuss whether or not she needs to call the office each time she takes Praluent injection. Please advise.

## 2020-05-20 NOTE — Addendum Note (Signed)
Addended by: Biagio Borg on: 05/20/2020 01:09 PM   Modules accepted: Orders

## 2020-05-20 NOTE — Telephone Encounter (Signed)
Spoke with patient about Praluent. She has taken first dose and tolerated well. She was advised that she does not need to notify us when she takes each injection unless there are issues.   She also states she is taking metoprolol tartate 25mg  BID. She states she talked to Dr. Debara Pickett about this medication and he mentioned to her that she may could try metoprolol succinate daily (50mg  is equivocal dose, per MD). She would like this mentioned to Dr. Jenny Reichmann, in case he would like to change her beta-blocker. Will send message to her PCP on her behalf.

## 2020-06-13 ENCOUNTER — Other Ambulatory Visit: Payer: Self-pay | Admitting: Internal Medicine

## 2020-06-13 NOTE — Telephone Encounter (Signed)
Please refill as per office routine med refill policy (all routine meds refilled for 3 mo or monthly per pt preference up to one year from last visit, then month to month grace period for 3 mo, then further med refills will have to be denied)  

## 2020-06-15 ENCOUNTER — Encounter: Payer: Self-pay | Admitting: Internal Medicine

## 2020-06-15 MED ORDER — LEVOTHYROXINE SODIUM 75 MCG PO TABS: 75.0000 ug | ORAL_TABLET | Freq: Every day | ORAL | 0 refills | Status: DC

## 2020-06-17 DIAGNOSIS — L309 Dermatitis, unspecified: Secondary | ICD-10-CM | POA: Diagnosis not present

## 2020-06-17 DIAGNOSIS — D225 Melanocytic nevi of trunk: Secondary | ICD-10-CM | POA: Diagnosis not present

## 2020-06-17 DIAGNOSIS — D2230 Melanocytic nevi of unspecified part of face: Secondary | ICD-10-CM | POA: Diagnosis not present

## 2020-06-17 DIAGNOSIS — L570 Actinic keratosis: Secondary | ICD-10-CM | POA: Diagnosis not present

## 2020-06-17 DIAGNOSIS — L821 Other seborrheic keratosis: Secondary | ICD-10-CM | POA: Diagnosis not present

## 2020-06-17 DIAGNOSIS — L578 Other skin changes due to chronic exposure to nonionizing radiation: Secondary | ICD-10-CM | POA: Diagnosis not present

## 2020-06-17 DIAGNOSIS — L853 Xerosis cutis: Secondary | ICD-10-CM | POA: Diagnosis not present

## 2020-06-25 ENCOUNTER — Ambulatory Visit: Payer: Medicare HMO | Admitting: Internal Medicine

## 2020-08-07 ENCOUNTER — Encounter: Payer: Self-pay | Admitting: Internal Medicine

## 2020-08-07 DIAGNOSIS — E7849 Other hyperlipidemia: Secondary | ICD-10-CM | POA: Diagnosis not present

## 2020-08-09 LAB — LIPID PANEL
Chol/HDL Ratio: 3.9 ratio (ref 0.0–4.4)
Cholesterol, Total: 219 mg/dL — ABNORMAL HIGH (ref 100–199)
HDL: 56 mg/dL (ref >39)
LDL Chol Calc (NIH): 142 mg/dL — ABNORMAL HIGH (ref 0–99)
Triglycerides: 116 mg/dL (ref 0–149)
VLDL Cholesterol Cal: 21 mg/dL (ref 5–40)

## 2020-08-09 LAB — LIPOPROTEIN A (LPA): Lipoprotein (a): 9.1 nmol/L (ref <75.0)

## 2020-08-12 ENCOUNTER — Encounter: Payer: Self-pay | Admitting: Internal Medicine

## 2020-08-12 ENCOUNTER — Other Ambulatory Visit: Payer: Self-pay

## 2020-08-12 ENCOUNTER — Ambulatory Visit: Payer: Medicare HMO | Admitting: Internal Medicine

## 2020-08-12 VITALS — BP 122/84 | HR 61 | Ht 66.0 in | Wt 117.2 lb

## 2020-08-12 DIAGNOSIS — T466X5A Adverse effect of antihyperlipidemic and antiarteriosclerotic drugs, initial encounter: Secondary | ICD-10-CM

## 2020-08-12 DIAGNOSIS — E7849 Other hyperlipidemia: Secondary | ICD-10-CM

## 2020-08-12 DIAGNOSIS — I779 Disorder of arteries and arterioles, unspecified: Secondary | ICD-10-CM | POA: Diagnosis not present

## 2020-08-12 DIAGNOSIS — M791 Myalgia, unspecified site: Secondary | ICD-10-CM | POA: Diagnosis not present

## 2020-08-12 MED ORDER — ROSUVASTATIN CALCIUM 5 MG PO TABS
5.0000 mg | ORAL_TABLET | Freq: Every day | ORAL | 3 refills | Status: DC
Start: 2020-08-12 — End: 2021-01-11

## 2020-08-12 NOTE — Patient Instructions (Addendum)
Medication Instructions:  START crestor 5 mg daily  *If you need a refill on your cardiac medications before your next appointment, please call your pharmacy*   Lab Work: FASTING lab work in 3 months (before next visit with Dr. Debara Pickett)  If you have labs (blood work) drawn today and your tests are completely normal, you will receive your results only by: Marland Kitchen MyChart Message (if you have MyChart) OR . A paper copy in the mail If you have any lab test that is abnormal or we need to change your treatment, we will call you to review the results.   Testing/Procedures: NONE   Follow-Up: At [facility], you and your health needs are our priority.  As part of our continuing mission to provide you with exceptional heart care, we have created designated Provider Care Teams.  These Care Teams include your primary Cardiologist (physician) and Advanced Practice Providers (APPs -  Physician Assistants and Nurse Practitioners) who all work together to provide you with the care you need, when you need it.  We recommend signing up for the patient portal called "MyChart".  Sign up information is provided on this After Visit Summary.  MyChart is used to connect with patients for Virtual Visits (Telemedicine).  Patients are able to view lab/test results, encounter notes, upcoming appointments, etc.  Non-urgent messages can be sent to your provider as well.   To learn more about what you can do with MyChart, go to [email or web address].    Your next appointment:   3 month(s) - lipid clinic  The format for your next appointment:   In Person  Provider:   K. Mali Hilty, MD   Other Instructions

## 2020-08-12 NOTE — Progress Notes (Signed)
LIPID CLINIC CONSULT NOTE  Chief Complaint:  Follow-up dyslipidemia  Primary Care Physician: Biagio Borg, MD  Primary Cardiologist:  No primary care provider on file.  HPI:  Jennifer Andrade is a 69 y.o. female who is being seen today for the evaluation of dyslipidemia at the request of Biagio Borg, MD.  This is a 69 year old female kindly referred by Dr. Jenny Reichmann for evaluation management of marked dyslipidemia.  Her recent lipid profile showed total cholesterol 362, HDL 48, LDL 285 and triglycerides 147.  She reports longstanding elevated cholesterol despite eating very little as far as unhealthy foods or high saturated fats.  Her BMI is only 18.  There is a strong family history of heart disease including her father who has had 3 MIs and had CABG starting in his 36s.  She also has 2 half-sisters who have coronary disease.  In addition she has known ASCVD with mild to moderate bilateral carotid artery disease.  Fortunately, she cannot tolerate statins due to significant myalgias and mental status changes.  Other medical problems include hypertension, hypothyroidism and stage III chronic kidney disease.  08/12/2020  Ms. Bento returns today for follow-up of dyslipidemia.  She has had an excellent response to Praluent 150 mg every 2 weeks.  Her lipid profile is substantially improved now showing total cholesterol 219, HDL 56, LDL 142 and triglycerides 116.  This is down from a total cholesterol 362 in the past and LDL of 285.  Certainly she has a familial hyperlipidemia with strong family history of heart disease in her father with early MIs in his 15s.  She has no known history of events, however.  It would be ideal to get her cholesterol lower if possible.  She reports no side effects on the PCSK9 inhibitor.  Previously, she had intolerances to atorvastatin and rosuvastatin although the rosuvastatin was possibly associated with some memory issues at higher doses.  We discussed about the  possibility of trying a lower dose.  PMHx:  Past Medical History:  Diagnosis Date  . ANEMIA-IRON DEFICIENCY 04/15/2008  . Cataract   . CKD (chronic kidney disease) stage 3, GFR 30-59 ml/min (HCC) 03/21/2019  . HYPERLIPIDEMIA 04/15/2008  . HYPERTENSION 04/15/2008  . HYPOTHYROIDISM 04/15/2008  . LUMBAR RADICULOPATHY, LEFT 11/24/2009  . Migraine 03/25/2011  . OSTEOPENIA 04/15/2008  . Other specified forms of hearing loss 09/30/2010  . SINUSITIS- ACUTE-NOS 09/30/2010  . Vitamin D deficiency 10/2011   level 16    Past Surgical History:  Procedure Laterality Date  . BACK SURGERY    . cataract surg     lens replacement Bil  . TUBAL LIGATION      FAMHx:  Family History  Problem Relation Age of Onset  . Heart disease Father   . Kidney disease Father   . Hypertension Father   . Thyroid disease Mother     SOCHx:   reports that she quit smoking about 3 years ago. Her smoking use included cigarettes. She smoked 1.00 pack per day. She has never used smokeless tobacco. She reports current alcohol use. She reports that she does not use drugs.  ALLERGIES:  Allergies  Allergen Reactions  . Atorvastatin     REACTION: myalgias  . Rosuvastatin     REACTION: memory problem    ROS: Pertinent items noted in HPI and remainder of comprehensive ROS otherwise negative.  HOME MEDS: Current Outpatient Medications on File Prior to Visit  Medication Sig Dispense Refill  . Alirocumab (PRALUENT) 150  MG/ML SOAJ Inject 1 Dose into the skin every 14 (fourteen) days. 2 pen 11  . candesartan (ATACAND) 32 MG tablet TAKE 1 TABLET BY MOUTH EVERY DAY 90 tablet 3  . levothyroxine (SYNTHROID) 75 MCG tablet Take 1 tablet (75 mcg total) by mouth daily. Keep follow=up appt for November for future refills 90 tablet 0  . metoprolol succinate (TOPROL-XL) 50 MG 24 hr tablet Take 1 tablet (50 mg total) by mouth daily. Take with or immediately following a meal. 90 tablet 2   No current facility-administered medications on  file prior to visit.    LABS/IMAGING: No results found for this or any previous visit (from the past 48 hour(s)). No results found.  LIPID PANEL:    Component Value Date/Time   CHOL 219 (H) 08/07/2020 0951   TRIG 116 08/07/2020 0951   HDL 56 08/07/2020 0951   CHOLHDL 3.9 08/07/2020 0951   CHOLHDL 8 03/19/2019 1001   VLDL 29.4 03/19/2019 1001   LDLCALC 142 (H) 08/07/2020 0951   LDLDIRECT 195.2 03/22/2011 0759    WEIGHTS: Wt Readings from Last 3 Encounters:  08/12/20 117 lb 3.2 oz (53.2 kg)  05/05/20 118 lb 12.8 oz (53.9 kg)  03/12/20 120 lb (54.4 kg)    VITALS: BP 122/84   Pulse 61   Ht 5\' 6"  (1.676 m)   Wt 117 lb 3.2 oz (53.2 kg)   LMP 10/24/2001   SpO2 94%   BMI 18.92 kg/m   EXAM: Deferred  EKG: Deferred  ASSESSMENT: 1. Probable familial hyperlipidemia - Dutch score of 7 2. ASCVD-bilateral carotid artery disease 3. CKD 3 4. Intolerance-myalgias 5. Family history of premature onset coronary disease in father  PLAN: 1.   Ms. Dech has had an excellent response to Praluent however LDL could be lower.  I would recommend trial of low-dose rosuvastatin 5 mg daily.  As this is highly potent he could give her extra reduction.  If it is not tolerated we could consider adding ezetimibe or possibly bempedoic acid.  Although she is already had a guideline recommended 50% reduction in LDL, it persists and further reduction could be associated with lower cardiovascular risk.  Plan follow-up with me in 3 months after repeat lipids.  Pixie Casino, MD, [facility], [place] Director of the [facility] Lipid Disorders &  Cardiovascular Risk Reduction Clinic Diplomate of the American Board of Clinical Lipidology Attending Cardiologist  Direct Dial: [phone]  Fax: [phone]  Website:  [email or web address] 08/12/2020, 10:40 AM

## 2020-09-06 ENCOUNTER — Other Ambulatory Visit: Payer: Self-pay | Admitting: Internal Medicine

## 2020-09-06 NOTE — Telephone Encounter (Signed)
Please refill as per office routine med refill policy (all routine meds refilled for 3 mo or monthly per pt preference up to one year from last visit, then month to month grace period for 3 mo, then further med refills will have to be denied)  

## 2020-09-09 ENCOUNTER — Other Ambulatory Visit (INDEPENDENT_AMBULATORY_CARE_PROVIDER_SITE_OTHER): Payer: Medicare HMO

## 2020-09-09 DIAGNOSIS — E538 Deficiency of other specified B group vitamins: Secondary | ICD-10-CM

## 2020-09-09 DIAGNOSIS — E559 Vitamin D deficiency, unspecified: Secondary | ICD-10-CM | POA: Diagnosis not present

## 2020-09-09 LAB — VITAMIN B12: Vitamin B-12: 476 pg/mL (ref 211–911)

## 2020-09-09 LAB — VITAMIN D 25 HYDROXY (VIT D DEFICIENCY, FRACTURES): VITD: 14.89 ng/mL — ABNORMAL LOW (ref 30.00–100.00)

## 2020-09-11 ENCOUNTER — Encounter: Payer: Self-pay | Admitting: Internal Medicine

## 2020-09-11 ENCOUNTER — Ambulatory Visit (INDEPENDENT_AMBULATORY_CARE_PROVIDER_SITE_OTHER): Payer: Medicare HMO | Admitting: Internal Medicine

## 2020-09-11 ENCOUNTER — Other Ambulatory Visit: Payer: Self-pay

## 2020-09-11 VITALS — BP 130/80 | HR 63 | Temp 98.5°F | Ht 66.0 in | Wt 116.0 lb

## 2020-09-11 DIAGNOSIS — N183 Chronic kidney disease, stage 3 unspecified: Secondary | ICD-10-CM

## 2020-09-11 DIAGNOSIS — E538 Deficiency of other specified B group vitamins: Secondary | ICD-10-CM

## 2020-09-11 DIAGNOSIS — E559 Vitamin D deficiency, unspecified: Secondary | ICD-10-CM

## 2020-09-11 DIAGNOSIS — Z0000 Encounter for general adult medical examination without abnormal findings: Secondary | ICD-10-CM | POA: Diagnosis not present

## 2020-09-11 MED ORDER — METOPROLOL SUCCINATE ER 50 MG PO TB24
50.0000 mg | ORAL_TABLET | Freq: Every day | ORAL | 3 refills | Status: DC
Start: 2020-09-11 — End: 2021-10-19

## 2020-09-11 MED ORDER — LEVOTHYROXINE SODIUM 75 MCG PO TABS
75.0000 ug | ORAL_TABLET | Freq: Every day | ORAL | 3 refills | Status: DC
Start: 2020-09-11 — End: 2021-09-13

## 2020-09-11 MED ORDER — CANDESARTAN CILEXETIL 32 MG PO TABS: ORAL_TABLET | ORAL | 3 refills | Status: DC

## 2020-09-11 NOTE — Assessment & Plan Note (Signed)
To start vit d3 2000 u qd

## 2020-09-11 NOTE — Progress Notes (Signed)
Subjective:    Patient ID: Jennifer Andrade, female    DOB: 10-31-1950, 69 y.o.   MRN: [id number]  HPI  Here for wellness and f/u;  Overall doing ok;  Pt denies Chest pain, worsening SOB, DOE, wheezing, orthopnea, PND, worsening LE edema, palpitations, dizziness or syncope.  Pt denies neurological change such as new headache, facial or extremity weakness.  Pt denies polydipsia, polyuria, or low sugar symptoms. Pt states overall good compliance with treatment and medications, good tolerability, and has been trying to follow appropriate diet.  Pt denies worsening depressive symptoms, suicidal ideation or panic. No fever, night sweats, wt loss, loss of appetite, or other constitutional symptoms.  Pt states good ability with ADL's, has low fall risk, home safety reviewed and adequate, no other significant changes in hearing or vision, and only occasionally active with exercise.  Has much difficulty with lab draws, and does not like [facility], [facility] or [facility], only likes Dr Debara Pickett lab.  asjs if taking the statin 5 mg daily seems ok to me Past Medical History:  Diagnosis Date  . ANEMIA-IRON DEFICIENCY 04/15/2008  . Cataract   . CKD (chronic kidney disease) stage 3, GFR 30-59 ml/min (HCC) 03/21/2019  . HYPERLIPIDEMIA 04/15/2008  . HYPERTENSION 04/15/2008  . HYPOTHYROIDISM 04/15/2008  . LUMBAR RADICULOPATHY, LEFT 11/24/2009  . Migraine 03/25/2011  . OSTEOPENIA 04/15/2008  . Other specified forms of hearing loss 09/30/2010  . SINUSITIS- ACUTE-NOS 09/30/2010  . Vitamin D deficiency 10/2011   level 16   Past Surgical History:  Procedure Laterality Date  . BACK SURGERY    . cataract surg     lens replacement Bil  . TUBAL LIGATION      reports that she quit smoking about 3 years ago. Her smoking use included cigarettes. She smoked 1.00 pack per day. She has never used smokeless tobacco. She reports current alcohol use. She reports that she does not use drugs. family history includes Heart disease in her  father; Hypertension in her father; Kidney disease in her father; Thyroid disease in her mother. Allergies  Allergen Reactions  . Atorvastatin     REACTION: myalgias  . Rosuvastatin     REACTION: memory problem   Current Outpatient Medications on File Prior to Visit  Medication Sig Dispense Refill  . Alirocumab (PRALUENT) 150 MG/ML SOAJ Inject 1 Dose into the skin every 14 (fourteen) days. 2 pen 11  . rosuvastatin (CRESTOR) 5 MG tablet Take 1 tablet (5 mg total) by mouth daily. 90 tablet 3   No current facility-administered medications on file prior to visit.   Review of Systems All otherwise neg per pt     Objective:   Physical Exam BP 130/80 (BP Location: Left Arm, Patient Position: Sitting, Cuff Size: Large)   Pulse 63   Temp 98.5 F (36.9 C) (Oral)   Ht 5\' 6"  (1.676 m)   Wt 116 lb (52.6 kg)   LMP 10/24/2001   SpO2 95%   BMI 18.72 kg/m  VS noted,  Constitutional: Pt appears in NAD HENT: Head: NCAT.  Right Ear: External ear normal.  Left Ear: External ear normal.  Eyes: . Pupils are equal, round, and reactive to light. Conjunctivae and EOM are normal Nose: without d/c or deformity Neck: Neck supple. Gross normal ROM Cardiovascular: Normal rate and regular rhythm.   Pulmonary/Chest: Effort normal and breath sounds without rales or wheezing.  Abd:  Soft, NT, ND, + BS, no organomegaly Neurological: Pt is alert. At baseline orientation,  motor grossly intact Skin: Skin is warm. No rashes, other new lesions, no LE edema Psychiatric: Pt behavior is normal without agitation  All otherwise neg per pt Lab Results  Component Value Date   WBC 5.6 03/19/2019   HGB 13.7 03/19/2019   HCT 40.3 03/19/2019   PLT 180.0 03/19/2019   GLUCOSE 102 (H) 03/19/2019   CHOL 219 (H) 08/07/2020   TRIG 116 08/07/2020   HDL 56 08/07/2020   LDLDIRECT 195.2 03/22/2011   LDLCALC 142 (H) 08/07/2020   ALT 9 03/19/2019   AST 13 03/19/2019   NA 142 03/19/2019   K 4.3 03/19/2019   CL 104  03/19/2019   CREATININE 1.15 03/19/2019   BUN 21 03/19/2019   CO2 29 03/19/2019   TSH 0.78 03/19/2019         Assessment & Plan:

## 2020-09-11 NOTE — Assessment & Plan Note (Signed)
Cont oral tx

## 2020-09-11 NOTE — Patient Instructions (Signed)
Please take OTC Vitamin D3 at 2000 units per day, indefinitely.  Please continue all other medications as before, and refills have been done if requested.  Please have the pharmacy call with any other refills you may need.  Please continue your efforts at being more active, low cholesterol diet, and weight control.  You are otherwise up to date with prevention measures today.  Please keep your appointments with your specialists as you may have planned  You are given the lab request to give to Dr Debara Pickett lab to see if they can draw there  You will be contacted by phone if any changes need to be made immediately.  Otherwise, you will receive a letter about your results with an explanation, but please check with MyChart first.  Please remember to sign up for MyChart if you have not done so, as this will be important to you in the future with finding out test results, communicating by private email, and scheduling acute appointments online when needed.  Please make an Appointment to return for your 1 year visit, or sooner if needed

## 2020-09-12 ENCOUNTER — Encounter: Payer: Self-pay | Admitting: Internal Medicine

## 2020-09-12 NOTE — Assessment & Plan Note (Addendum)
Overall doing well, age appropriate education and counseling updated, referrals for preventative services and immunizations addressed, dietary and smoking counseling addressed, most recent labs reviewed.  I have personally reviewed and have noted:  1) the patient's medical and social history 2) The pt's use of alcohol, tobacco, and illicit drugs 3) The patient's current medications and supplements 4) Functional ability including ADL's, fall risk, home safety risk, hearing and visual impairment 5) Diet and physical activities 6) Evidence for depression or mood disorder 7) The patient's height, weight, and BMI have been recorded in the chart  I have made referrals, and provided counseling and education based on review of the above  Gave lab order written to be done per dr hilty office if possible

## 2020-09-12 NOTE — Assessment & Plan Note (Signed)
stable overall by history and exam, recent data reviewed with pt, and pt to continue medical treatment as before,  to f/u any worsening symptoms or concerns, avoid nephrotoxins

## 2020-09-28 ENCOUNTER — Other Ambulatory Visit: Payer: Self-pay

## 2020-09-28 ENCOUNTER — Ambulatory Visit (INDEPENDENT_AMBULATORY_CARE_PROVIDER_SITE_OTHER): Payer: Medicare HMO

## 2020-09-28 VITALS — BP 128/70 | HR 60 | Temp 98.4°F | Ht 66.0 in | Wt 115.2 lb

## 2020-09-28 DIAGNOSIS — Z0000 Encounter for general adult medical examination without abnormal findings: Secondary | ICD-10-CM | POA: Diagnosis not present

## 2020-09-28 NOTE — Patient Instructions (Signed)
Ms. Jennifer Andrade , Thank you for taking time to come for your Medicare Wellness Visit. I appreciate your ongoing commitment to your health goals. Please review the following plan we discussed and let me know if I can assist you in the future.   Screening recommendations/referrals: Colonoscopy: declined Mammogram: declined Bone Density: declined Recommended yearly ophthalmology/optometry visit for glaucoma screening and checkup Recommended yearly dental visit for hygiene and checkup  Vaccinations: Influenza vaccine: declined Pneumococcal vaccine: declined Tdap vaccine: declined Shingles vaccine: declined   Covid-19: declined  Advanced directives: Please bring a copy of your health care power of attorney and living will to the office at your convenience.  Conditions/risks identified: Yes; Reviewed health maintenance screenings with patient today and relevant education, vaccines, and/or referrals were provided. Please continue to do your personal lifestyle choices by: daily care of teeth and gums, regular physical activity (goal should be 5 days a week for 30 minutes), eat a healthy diet, avoid tobacco and drug use, limiting any alcohol intake, taking a low-dose aspirin (if not allergic or have been advised by your provider otherwise) and taking vitamins and minerals as recommended by your provider. Continue doing brain stimulating activities (puzzles, reading, adult coloring books, staying active) to keep memory sharp. Continue to eat heart healthy diet (full of fruits, vegetables, whole grains, lean protein, water--limit salt, fat, and sugar intake) and increase physical activity as tolerated.  Next appointment: Please schedule your next Medicare Wellness Visit with your Nurse Health Advisor in 1 year by calling [phone].   Preventive Care 43 Years and Older, Female Preventive care refers to lifestyle choices and visits with your health care provider that can promote health and wellness. What  does preventive care include?  A yearly physical exam. This is also called an annual well check.  Dental exams once or twice a year.  Routine eye exams. Ask your health care provider how often you should have your eyes checked.  Personal lifestyle choices, including:  Daily care of your teeth and gums.  Regular physical activity.  Eating a healthy diet.  Avoiding tobacco and drug use.  Limiting alcohol use.  Practicing safe sex.  Taking low-dose aspirin every day.  Taking vitamin and mineral supplements as recommended by your health care provider. What happens during an annual well check? The services and screenings done by your health care provider during your annual well check will depend on your age, overall health, lifestyle risk factors, and family history of disease. Counseling  Your health care provider may ask you questions about your:  Alcohol use.  Tobacco use.  Drug use.  Emotional well-being.  Home and relationship well-being.  Sexual activity.  Eating habits.  History of falls.  Memory and ability to understand (cognition).  Work and work Statistician.  Reproductive health. Screening  You may have the following tests or measurements:  Height, weight, and BMI.  Blood pressure.  Lipid and cholesterol levels. These may be checked every 5 years, or more frequently if you are over 53 years old.  Skin check.  Lung cancer screening. You may have this screening every year starting at age 67 if you have a 30-pack-year history of smoking and currently smoke or have quit within the past 15 years.  Fecal occult blood test (FOBT) of the stool. You may have this test every year starting at age 15.  Flexible sigmoidoscopy or colonoscopy. You may have a sigmoidoscopy every 5 years or a colonoscopy every 10 years starting at age 97.  Hepatitis  C blood test.  Hepatitis B blood test.  Sexually transmitted disease (STD) testing.  Diabetes screening.  This is done by checking your blood sugar (glucose) after you have not eaten for a while (fasting). You may have this done every 1-3 years.  Bone density scan. This is done to screen for osteoporosis. You may have this done starting at age 67.  Mammogram. This may be done every 1-2 years. Talk to your health care provider about how often you should have regular mammograms. Talk with your health care provider about your test results, treatment options, and if necessary, the need for more tests. Vaccines  Your health care provider may recommend certain vaccines, such as:  Influenza vaccine. This is recommended every year.  Tetanus, diphtheria, and acellular pertussis (Tdap, Td) vaccine. You may need a Td booster every 10 years.  Zoster vaccine. You may need this after age 67.  Pneumococcal 13-valent conjugate (PCV13) vaccine. One dose is recommended after age 67.  Pneumococcal polysaccharide (PPSV23) vaccine. One dose is recommended after age 67. Talk to your health care provider about which screenings and vaccines you need and how often you need them. This information is not intended to replace advice given to you by your health care provider. Make sure you discuss any questions you have with your health care provider. Document Released: 11/06/2015 Document Revised: 06/29/2016 Document Reviewed: 08/11/2015 Elsevier Interactive Patient Education  2017 [place] Prevention in the Home Falls can cause injuries. They can happen to people of all ages. There are many things you can do to make your home safe and to help prevent falls. What can I do on the outside of my home?  Regularly fix the edges of walkways and driveways and fix any cracks.  Remove anything that might make you trip as you walk through a door, such as a raised step or threshold.  Trim any bushes or trees on the path to your home.  Use bright outdoor lighting.  Clear any walking paths of anything that might  make someone trip, such as rocks or tools.  Regularly check to see if handrails are loose or broken. Make sure that both sides of any steps have handrails.  Any raised decks and porches should have guardrails on the edges.  Have any leaves, snow, or ice cleared regularly.  Use sand or salt on walking paths during winter.  Clean up any spills in your garage right away. This includes oil or grease spills. What can I do in the bathroom?  Use night lights.  Install grab bars by the toilet and in the tub and shower. Do not use towel bars as grab bars.  Use non-skid mats or decals in the tub or shower.  If you need to sit down in the shower, use a plastic, non-slip stool.  Keep the floor dry. Clean up any water that spills on the floor as soon as it happens.  Remove soap buildup in the tub or shower regularly.  Attach bath mats securely with double-sided non-slip rug tape.  Do not have throw rugs and other things on the floor that can make you trip. What can I do in the bedroom?  Use night lights.  Make sure that you have a light by your bed that is easy to reach.  Do not use any sheets or blankets that are too big for your bed. They should not hang down onto the floor.  Have a firm chair that has side arms.  You can use this for support while you get dressed.  Do not have throw rugs and other things on the floor that can make you trip. What can I do in the kitchen?  Clean up any spills right away.  Avoid walking on wet floors.  Keep items that you use a lot in easy-to-reach places.  If you need to reach something above you, use a strong step stool that has a grab bar.  Keep electrical cords out of the way.  Do not use floor polish or wax that makes floors slippery. If you must use wax, use non-skid floor wax.  Do not have throw rugs and other things on the floor that can make you trip. What can I do with my stairs?  Do not leave any items on the stairs.  Make sure  that there are handrails on both sides of the stairs and use them. Fix handrails that are broken or loose. Make sure that handrails are as long as the stairways.  Check any carpeting to make sure that it is firmly attached to the stairs. Fix any carpet that is loose or worn.  Avoid having throw rugs at the top or bottom of the stairs. If you do have throw rugs, attach them to the floor with carpet tape.  Make sure that you have a light switch at the top of the stairs and the bottom of the stairs. If you do not have them, ask someone to add them for you. What else can I do to help prevent falls?  Wear shoes that:  Do not have high heels.  Have rubber bottoms.  Are comfortable and fit you well.  Are closed at the toe. Do not wear sandals.  If you use a stepladder:  Make sure that it is fully opened. Do not climb a closed stepladder.  Make sure that both sides of the stepladder are locked into place.  Ask someone to hold it for you, if possible.  Clearly mark and make sure that you can see:  Any grab bars or handrails.  First and last steps.  Where the edge of each step is.  Use tools that help you move around (mobility aids) if they are needed. These include:  Canes.  Walkers.  Scooters.  Crutches.  Turn on the lights when you go into a dark area. Replace any light bulbs as soon as they burn out.  Set up your furniture so you have a clear path. Avoid moving your furniture around.  If any of your floors are uneven, fix them.  If there are any pets around you, be aware of where they are.  Review your medicines with your doctor. Some medicines can make you feel dizzy. This can increase your chance of falling. Ask your doctor what other things that you can do to help prevent falls. This information is not intended to replace advice given to you by your health care provider. Make sure you discuss any questions you have with your health care provider. Document  Released: 08/06/2009 Document Revised: 03/17/2016 Document Reviewed: 11/14/2014 Elsevier Interactive Patient Education  2017 [employer].

## 2020-09-28 NOTE — Progress Notes (Signed)
Subjective:   Jennifer Andrade is a 69 y.o. female who presents for Medicare Annual (Subsequent) preventive examination.  Review of Systems    No ROS. Medicare Wellness Visit. Additional risk factors are reflected in social history. Cardiac Risk Factors include: advanced age (>77men, >16 women);dyslipidemia;family history of premature cardiovascular disease;hypertension Sleep Patterns: No sleep issues, feels rested on waking and sleeps 8 hours nightly. Home Safety/Smoke Alarms: Feels safe in home; uses home alarm. Smoke alarms in place. Living environment: 1-story home; Lives with spouse; no needs for DME; good support system. Seat Belt Safety/Bike Helmet: Wears seat belt.    Objective:    Today's Vitals   09/28/20 1500  BP: 128/70  Pulse: 60  Temp: 98.4 F (36.9 C)  SpO2: 97%  Weight: 115 lb 3.2 oz (52.3 kg)  Height: 5\' 6"  (1.676 m)  PainSc: 0-No pain   Body mass index is 18.59 kg/m.  Advanced Directives 09/28/2020  Does Patient Have a Medical Advance Directive? Yes  Does patient want to make changes to medical advance directive? No - Patient declined    Current Medications (verified) Outpatient Encounter Medications as of 09/28/2020  Medication Sig  . Alirocumab (PRALUENT) 150 MG/ML SOAJ Inject 1 Dose into the skin every 14 (fourteen) days.  . candesartan (ATACAND) 32 MG tablet TAKE 1 TABLET BY MOUTH EVERY DAY  . levothyroxine (SYNTHROID) 75 MCG tablet Take 1 tablet (75 mcg total) by mouth daily. Keep follow=up appt for November for future refills  . metoprolol succinate (TOPROL-XL) 50 MG 24 hr tablet Take 1 tablet (50 mg total) by mouth daily. Take with or immediately following a meal.  . rosuvastatin (CRESTOR) 5 MG tablet Take 1 tablet (5 mg total) by mouth daily.   No facility-administered encounter medications on file as of 09/28/2020.    Allergies (verified) Atorvastatin and Rosuvastatin   History: Past Medical History:  Diagnosis Date  . ANEMIA-IRON  DEFICIENCY 04/15/2008  . Cataract   . CKD (chronic kidney disease) stage 3, GFR 30-59 ml/min (HCC) 03/21/2019  . HYPERLIPIDEMIA 04/15/2008  . HYPERTENSION 04/15/2008  . HYPOTHYROIDISM 04/15/2008  . LUMBAR RADICULOPATHY, LEFT 11/24/2009  . Migraine 03/25/2011  . OSTEOPENIA 04/15/2008  . Other specified forms of hearing loss 09/30/2010  . SINUSITIS- ACUTE-NOS 09/30/2010  . Vitamin D deficiency 10/2011   level 16   Past Surgical History:  Procedure Laterality Date  . BACK SURGERY    . cataract surg     lens replacement Bil  . TUBAL LIGATION     Family History  Problem Relation Age of Onset  . Heart disease Father   . Kidney disease Father   . Hypertension Father   . Thyroid disease Mother    Social History   Socioeconomic History  . Marital status: Married    Spouse name: Not on file  . Number of children: 3  . Years of education: Not on file  . Highest education level: Not on file  Occupational History  . 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 Smoking status: Former Smoker    Packs/day: 1.00    Types: Cigarettes    Quit date: 09/28/2016    Years since quitting: 4.0  . Smokeless tobacco: Never Used  Substance and Sexual Activity  . Alcohol use: Yes    Alcohol/week: 0.0 standard drinks    Comment: Rrae  . Drug use: No  . Sexual activity: Never  Birth control/protection: Surgical    Comment: 1st intercourse 69 yo--Fewer than 5 partners  Other Topics Concern  . Not on file  Social History Narrative  . Not on file   Social Determinants of Health   Financial Resource Strain: Low Risk   . Difficulty of Paying Living Expenses: Not hard at all  Food Insecurity: No Food Insecurity  . Worried About Charity fundraiser in the Last Year: Never true  . Ran Out of Food in the Last Year: Never true  Transportation Needs: No Transportation Needs  . Lack of Transportation (Medical): No  . Lack of  Transportation (Non-Medical): No  Physical Activity: Sufficiently Active  . Days of Exercise per Week: 5 days  . Minutes of Exercise per Session: 30 min  Stress: No Stress Concern Present  . Feeling of Stress : Not at all  Social Connections: Socially Integrated  . Frequency of Communication with Friends and Family: More than three times a week  . Frequency of Social Gatherings with Friends and Family: More than three times a week  . Attends Religious Services: More than 4 times per year  . Active Member of Clubs or Organizations: Yes  . Attends Archivist Meetings: More than 4 times per year  . Marital Status: Married    Tobacco Counseling Counseling given: Not Answered   Clinical Intake:  Pre-visit preparation completed: Yes  Pain : No/denies pain Pain Score: 0-No pain     BMI - recorded: 18.59 Nutritional Status: BMI <19  Underweight Nutritional Risks: None Diabetes: No  How often do you need to have someone help you when you read instructions, pamphlets, or other written materials from your doctor or pharmacy?: 1 - Never What is the last grade level you completed in school?: 11th grade  Diabetic? no  Interpreter Needed?: No  Information entered by :: Lisette Abu, LPN   Activities of Daily Living In your present state of health, do you have any difficulty performing the following activities: 09/28/2020 09/11/2020  Hearing? N N  Vision? N N  Difficulty concentrating or making decisions? N N  Walking or climbing stairs? N N  Dressing or bathing? N N  Doing errands, shopping? N N  Preparing Food and eating ? N -  Using the Toilet? N -  In the past six months, have you accidently leaked urine? N -  Do you have problems with loss of bowel control? N -  Managing your Medications? N -  Managing your Finances? N -  Housekeeping or managing your Housekeeping? N -  Some recent data might be hidden    Patient Care Team: Biagio Borg, MD as PCP -  General  Indicate any recent Medical Services you may have received from other than [facility] providers in the past year (date may be approximate).     Assessment:   This is a routine wellness examination for Jennifer Andrade.  Hearing/Vision screen No exam data present  Dietary issues and exercise activities discussed: Current Exercise Habits: Home exercise routine, Type of exercise: walking, Time (Minutes): 30, Frequency (Times/Week): 5, Weekly Exercise (Minutes/Week): 150, Intensity: Moderate, Exercise limited by: None identified  Goals    .  Patient Stated (pt-stated)      To maintain my current health status by continuing to eat healthy, stay physically active and socially active.      Depression Screen PHQ 2/9 Scores 09/28/2020 09/11/2020 09/11/2020 03/12/2020 03/15/2019 04/10/2018 02/14/2017  PHQ - 2 Score 0 1 0 0 1  0 0  PHQ- 9 Score - - - - - 0 -    Fall Risk Fall Risk  09/28/2020 09/11/2020 09/11/2020 03/12/2020 03/15/2019  Falls in the past year? 0 0 0 0 0  Number falls in past yr: 0 - 0 - -  Injury with Fall? 0 - 0 - -  Risk for fall due to : No Fall Risks - No Fall Risks - -  Follow up Falls evaluation completed - Falls evaluation completed - -    FALL RISK PREVENTION PERTAINING TO THE HOME:  Any stairs in or around the home? No  If so, are there any without handrails? No  Home free of loose throw rugs in walkways, pet beds, electrical cords, etc? Yes  Adequate lighting in your home to reduce risk of falls? Yes   ASSISTIVE DEVICES UTILIZED TO PREVENT FALLS:  Life alert? No  Use of a cane, walker or w/c? No  Grab bars in the bathroom? Yes  Shower chair or bench in shower? No  Elevated toilet seat or a handicapped toilet? Yes   TIMED UP AND GO:  Was the test performed? No .  Length of time to ambulate 10 feet: 0 sec.   Gait steady and fast without use of assistive device  Cognitive Function: Normal cognitive status assessed by direct observation by this Nurse Health  Advisor. No abnormalities found.         Immunizations Immunization History  Administered Date(s) Administered  . PFIZER SARS-COV-2 Vaccination 01/06/2020, 01/27/2020    TDAP status: Due, Education has been provided regarding the importance of this vaccine. Advised may receive this vaccine at local pharmacy or Health Dept. Aware to provide a copy of the vaccination record if obtained from local pharmacy or Health Dept. Verbalized acceptance and understanding.  Flu Vaccine status: Declined, Education has been provided regarding the importance of this vaccine but patient still declined. Advised may receive this vaccine at local pharmacy or Health Dept. Aware to provide a copy of the vaccination record if obtained from local pharmacy or Health Dept. Verbalized acceptance and understanding.  Pneumococcal vaccine status: Declined,  Education has been provided regarding the importance of this vaccine but patient still declined. Advised may receive this vaccine at local pharmacy or Health Dept. Aware to provide a copy of the vaccination record if obtained from local pharmacy or Health Dept. Verbalized acceptance and understanding.   Covid-19 vaccine status: Declined, Education has been provided regarding the importance of this vaccine but patient still declined. Advised may receive this vaccine at local pharmacy or Health Dept.or vaccine clinic. Aware to provide a copy of the vaccination record if obtained from local pharmacy or Health Dept. Verbalized acceptance and understanding.  Qualifies for Shingles Vaccine? Yes   Zostavax completed No   Shingrix Completed?: No.    Education has been provided regarding the importance of this vaccine. Patient has been advised to call insurance company to determine out of pocket expense if they have not yet received this vaccine. Advised may also receive vaccine at local pharmacy or Health Dept. Verbalized acceptance and understanding.  Screening Tests Health  Maintenance  Topic Date Due  . INFLUENZA VACCINE  01/21/2021 (Originally 05/24/2020)  . COLONOSCOPY  03/12/2021 (Originally 11/17/2002)  . MAMMOGRAM  09/11/2021 (Originally 11/17/2002)  . DEXA SCAN  09/11/2021 (Originally 11/17/2017)  . TETANUS/TDAP  09/11/2021 (Originally 11/18/1971)  . PNA vac Low Risk Adult (1 of 2 - PCV13) 09/11/2021 (Originally 11/17/2017)  . COVID-19 Vaccine  Completed  .  Hepatitis C Screening  Completed    Health Maintenance  There are no preventive care reminders to display for this patient.  Colorectal cancer screening: No longer required.  (Patient declined)  Mammogram status: No longer required due to patient declined.  Bone density status: Patient declined  Lung Cancer Screening: (Low Dose CT Chest recommended if Age 41-80 years, 30 pack-year currently smoking OR have quit w/in 15years.) does not qualify.   Lung Cancer Screening Referral: no  Additional Screening:  Hepatitis C Screening: does qualify; Completed yes  Vision Screening: Recommended annual ophthalmology exams for early detection of glaucoma and other disorders of the eye. Is the patient up to date with their annual eye exam?  Yes  Who is the provider or what is the name of the office in which the patient attends annual eye exams? Darleen Crocker, MD. If pt is not established with a provider, would they like to be referred to a provider to establish care? No .   Dental Screening: Recommended annual dental exams for proper oral hygiene  Community Resource Referral / Chronic Care Management: CRR required this visit?  No   CCM required this visit?  No      Plan:     I have personally reviewed and noted the following in the patient's chart:   . Medical and social history . Use of alcohol, tobacco or illicit drugs  . Current medications and supplements . Functional ability and status . Nutritional status . Physical activity . Advanced directives . List of other physicians .  Hospitalizations, surgeries, and ER visits in previous 12 months . Vitals . Screenings to include cognitive, depression, and falls . Referrals and appointments  In addition, I have reviewed and discussed with patient certain preventive protocols, quality metrics, and best practice recommendations. A written personalized care plan for preventive services as well as general preventive health recommendations were provided to patient.     Sheral Flow, LPN   [id number]   Nurse Notes: n/a

## 2020-11-10 ENCOUNTER — Other Ambulatory Visit: Payer: Self-pay | Admitting: Internal Medicine

## 2020-11-18 ENCOUNTER — Ambulatory Visit: Payer: Medicare HMO | Admitting: Internal Medicine

## 2020-12-07 ENCOUNTER — Encounter: Payer: Self-pay | Admitting: Internal Medicine

## 2020-12-07 DIAGNOSIS — Z0000 Encounter for general adult medical examination without abnormal findings: Secondary | ICD-10-CM

## 2020-12-14 ENCOUNTER — Other Ambulatory Visit: Payer: Self-pay

## 2020-12-14 ENCOUNTER — Ambulatory Visit (AMBULATORY_SURGERY_CENTER): Payer: Self-pay

## 2020-12-14 VITALS — Ht 66.0 in | Wt 113.0 lb

## 2020-12-14 DIAGNOSIS — Z1211 Encounter for screening for malignant neoplasm of colon: Secondary | ICD-10-CM

## 2020-12-14 NOTE — Progress Notes (Signed)
No egg or soy allergy known to patient  No issues with past sedation with any surgeries or procedures No intubation problems in the past  No FH of Malignant Hyperthermia No diet pills per patient No home 02 use per patient  No blood thinners per patient  Pt denies issues with constipation  No A fib or A flutter  EMMI video to pt or via [place] 19 guidelines implemented in PV today with Pt and RN   Pt is fully vaccinated  for Covid   Pt denies loose or missing teeth, has lower dentures, denies partials, dental implants, capped or bonded teeth    Due to the COVID-19 pandemic we are asking patients to follow certain guidelines.  Pt aware of COVID protocols and [facility] guidelines

## 2020-12-21 ENCOUNTER — Encounter: Payer: Self-pay | Admitting: Gastroenterology

## 2020-12-28 ENCOUNTER — Telehealth: Payer: Self-pay | Admitting: Gastroenterology

## 2020-12-28 NOTE — Telephone Encounter (Signed)
After hours call: intolerance to colonoscopy prep  Scheduled for screening colonoscopy with Dr. Rush Landmark tomorrow. Not tolerating the prep due to nausea and vomiting. Discussed possible management strategies. She would prefer to cancel the procedure and have an office visit with Dr. Rush Landmark to discuss.  Patty, please arrange an office visit with Dr. Rush Landmark.

## 2020-12-29 ENCOUNTER — Encounter: Payer: Medicare HMO | Admitting: Gastroenterology

## 2020-12-29 NOTE — Telephone Encounter (Signed)
The pt has been given the appt date and time. She had no questions at this time.

## 2020-12-29 NOTE — Telephone Encounter (Signed)
Recall entered  Appt with Dr Rush Landmark on 02/04/21 at 150 pm

## 2020-12-29 NOTE — Telephone Encounter (Signed)
KLB, I am sorry to hear this. [place] Charge Rns please be aware for this patient and need for rescheduling. Please set up clinic visit with myself or one of the APPs for further discussion as to preparation and need for anti-emetic availability. Please place a recall in system for 67-months as well, if she hasn't already been rescheduled. Thanks. GM

## 2021-01-08 DIAGNOSIS — E7849 Other hyperlipidemia: Secondary | ICD-10-CM | POA: Diagnosis not present

## 2021-01-08 LAB — LIPID PANEL
Chol/HDL Ratio: 3.5 ratio (ref 0.0–4.4)
Cholesterol, Total: 204 mg/dL — ABNORMAL HIGH (ref 100–199)
HDL: 58 mg/dL (ref >39)
LDL Chol Calc (NIH): 126 mg/dL — ABNORMAL HIGH (ref 0–99)
Triglycerides: 112 mg/dL (ref 0–149)
VLDL Cholesterol Cal: 20 mg/dL (ref 5–40)

## 2021-01-11 ENCOUNTER — Encounter: Payer: Self-pay | Admitting: Internal Medicine

## 2021-01-11 ENCOUNTER — Ambulatory Visit: Payer: Medicare HMO | Admitting: Internal Medicine

## 2021-01-11 ENCOUNTER — Other Ambulatory Visit: Payer: Self-pay

## 2021-01-11 VITALS — BP 162/88 | HR 66 | Ht 66.0 in | Wt 112.8 lb

## 2021-01-11 DIAGNOSIS — E7849 Other hyperlipidemia: Secondary | ICD-10-CM | POA: Diagnosis not present

## 2021-01-11 DIAGNOSIS — M791 Myalgia, unspecified site: Secondary | ICD-10-CM | POA: Diagnosis not present

## 2021-01-11 DIAGNOSIS — E039 Hypothyroidism, unspecified: Secondary | ICD-10-CM

## 2021-01-11 DIAGNOSIS — I779 Disorder of arteries and arterioles, unspecified: Secondary | ICD-10-CM | POA: Diagnosis not present

## 2021-01-11 DIAGNOSIS — T466X5A Adverse effect of antihyperlipidemic and antiarteriosclerotic drugs, initial encounter: Secondary | ICD-10-CM

## 2021-01-11 DIAGNOSIS — T466X5D Adverse effect of antihyperlipidemic and antiarteriosclerotic drugs, subsequent encounter: Secondary | ICD-10-CM

## 2021-01-11 MED ORDER — EZETIMIBE 10 MG PO TABS: 10.0000 mg | ORAL_TABLET | Freq: Every day | ORAL | 3 refills | Status: DC

## 2021-01-11 NOTE — Progress Notes (Signed)
LIPID CLINIC CONSULT NOTE  Chief Complaint:  Follow-up dyslipidemia  Primary Care Physician: Biagio Borg, MD  Primary Cardiologist:  No primary care provider on file.  HPI:  Jennifer Andrade is a 70 y.o. female who is being seen today for the evaluation of dyslipidemia at the request of Biagio Borg, MD.  This is a 70 year old female kindly referred by Dr. Jenny Reichmann for evaluation management of marked dyslipidemia.  Her recent lipid profile showed total cholesterol 362, HDL 48, LDL 285 and triglycerides 147.  She reports longstanding elevated cholesterol despite eating very little as far as unhealthy foods or high saturated fats.  Her BMI is only 18.  There is a strong family history of heart disease including her father who has had 3 MIs and had CABG starting in his 15s.  She also has 2 half-sisters who have coronary disease.  In addition she has known ASCVD with mild to moderate bilateral carotid artery disease.  Fortunately, she cannot tolerate statins due to significant myalgias and mental status changes.  Other medical problems include hypertension, hypothyroidism and stage III chronic kidney disease.  08/12/2020  Jennifer Andrade returns today for follow-up of dyslipidemia.  She has had an excellent response to Praluent 150 mg every 2 weeks.  Her lipid profile is substantially improved now showing total cholesterol 219, HDL 56, LDL 142 and triglycerides 116.  This is down from a total cholesterol 362 in the past and LDL of 285.  Certainly she has a familial hyperlipidemia with strong family history of heart disease in her father with early MIs in his 54s.  She has no known history of events, however.  It would be ideal to get her cholesterol lower if possible.  She reports no side effects on the PCSK9 inhibitor.  Previously, she had intolerances to atorvastatin and rosuvastatin although the rosuvastatin was possibly associated with some memory issues at higher doses.  We discussed about the  possibility of trying a lower dose.  01/11/2021  Jennifer Andrade is seen today in follow-up.  Her lipids are incrementally improved somewhat, however unfortunately not tolerate rosuvastatin.  She is felt like she had some memory follow-up.  Her LDL cholesterol is lower though down to 126 from 142.  Blood pressure was elevated today.  She remains on Praluent which has brought her LDL down from 285 previously.  Although she has had a 50% reduction, given her significant carotid artery burden, would like to see if we could lower her cholesterol even further.  She says in the past she was very tolerant of ezetimibe however stopped taking the medication because the cost went up to $800 a month.  Since then the medicine is now generic and it may be a good option for her.  PMHx:  Past Medical History:  Diagnosis Date  . ANEMIA-IRON DEFICIENCY 04/15/2008  . Cataract   . CKD (chronic kidney disease) stage 3, GFR 30-59 ml/min (HCC) 03/21/2019  . Heart murmur    as a child  . HYPERLIPIDEMIA 04/15/2008  . HYPERTENSION 04/15/2008  . HYPOTHYROIDISM 04/15/2008  . LUMBAR RADICULOPATHY, LEFT 11/24/2009  . Migraine 03/25/2011  . OSTEOPENIA 04/15/2008  . Other specified forms of hearing loss 09/30/2010  . Phlebitis 1974   when pregnant  . SINUSITIS- ACUTE-NOS 09/30/2010  . Vitamin D deficiency 10/2011   level 16    Past Surgical History:  Procedure Laterality Date  . BACK SURGERY    . cataract surg     lens replacement  Bil  . tooth removal  2002  . TUBAL LIGATION      FAMHx:  Family History  Problem Relation Age of Onset  . Heart disease Father   . Kidney disease Father   . Hypertension Father   . Thyroid disease Mother   . Colon cancer Neg Hx   . Colon polyps Neg Hx   . Esophageal cancer Neg Hx   . Rectal cancer Neg Hx   . Stomach cancer Neg Hx     SOCHx:   reports that she quit smoking about 4 years ago. Her smoking use included cigarettes. She has a 47.00 pack-year smoking history. She has never  used smokeless tobacco. She reports current alcohol use of about 1.0 standard drink of alcohol per week. She reports that she does not use drugs.  ALLERGIES:  Allergies  Allergen Reactions  . Atorvastatin     REACTION: myalgias  . Rosuvastatin     REACTION: memory problem    ROS: Pertinent items noted in HPI and remainder of comprehensive ROS otherwise negative.  HOME MEDS: Current Outpatient Medications on File Prior to Visit  Medication Sig Dispense Refill  . Alirocumab (PRALUENT) 150 MG/ML SOAJ Inject 1 Dose into the skin every 14 (fourteen) days. 2 pen 11  . candesartan (ATACAND) 32 MG tablet TAKE 1 TABLET BY MOUTH EVERY DAY 90 tablet 3  . hydrocortisone (ANUSOL-HC) 2.5 % rectal cream PLACE 1 APPLICATION RECTALLY 2 (TWO) TIMES DAILY. 30 g 0  . levothyroxine (SYNTHROID) 75 MCG tablet Take 1 tablet (75 mcg total) by mouth daily. Keep follow=up appt for November for future refills 90 tablet 3  . metoprolol succinate (TOPROL-XL) 50 MG 24 hr tablet Take 1 tablet (50 mg total) by mouth daily. Take with or immediately following a meal. 90 tablet 3   No current facility-administered medications on file prior to visit.    LABS/IMAGING: No results found for this or any previous visit (from the past 48 hour(s)). No results found.  LIPID PANEL:    Component Value Date/Time   CHOL 204 (H) 01/08/2021 0936   TRIG 112 01/08/2021 0936   HDL 58 01/08/2021 0936   CHOLHDL 3.5 01/08/2021 0936   CHOLHDL 8 03/19/2019 1001   VLDL 29.4 03/19/2019 1001   LDLCALC 126 (H) 01/08/2021 0936   LDLDIRECT 195.2 03/22/2011 0759    WEIGHTS: Wt Readings from Last 3 Encounters:  01/11/21 112 lb 12.8 oz (51.2 kg)  12/14/20 113 lb (51.3 kg)  09/28/20 115 lb 3.2 oz (52.3 kg)    VITALS: BP (!) 162/88   Pulse 66   Ht 5\' 6"  (1.676 m)   Wt 112 lb 12.8 oz (51.2 kg)   LMP 10/24/2001   SpO2 95%   BMI 18.21 kg/m   EXAM: Deferred  EKG: Deferred  ASSESSMENT: 1. Probable familial hyperlipidemia -  Dutch score of 7 2. ASCVD-bilateral carotid artery disease 3. CKD 3 4. Intolerance-myalgias 5. Family history of premature onset coronary disease in father  PLAN: 1.   Ms. Downard has had even further incremental improvement in her lipids but could not tolerate rosuvastatin.  She previously had been on ezetimibe which was well-tolerated however cost was the main issue and it was discontinued.  Since that has become generic I think it would be a good additional option to her Praluent.  We will start ezetimibe 10 mg daily today.  Finally, Dr. Jenny Reichmann, her PCP asked [place] to draw some blood work which we will order in addition to  follow-up labs in 3 months  Plan follow-up with me in 3 months after repeat lipids.  Pixie Casino, MD, [facility], [place] Director of the Advanced Lipid Disorders &  Cardiovascular Risk Reduction Clinic Diplomate of the American Board of Clinical Lipidology Attending Cardiologist  Direct Dial: [phone]  Fax: [phone]  Website:  [email or web address] 01/11/2021, 11:55 AM

## 2021-01-11 NOTE — Patient Instructions (Signed)
Medication Instructions:  START zetia 10mg  daily *If you need a refill on your cardiac medications before your next appointment, please call your pharmacy*   Lab Work: FASTING lab work in 3 months -- lipid panel, TSH, CBC, CMET  If you have labs (blood work) drawn today and your tests are completely normal, you will receive your results only by: Marland Kitchen MyChart Message (if you have MyChart) OR . A paper copy in the mail If you have any lab test that is abnormal or we need to change your treatment, we will call you to review the results.   Testing/Procedures: NONE   Follow-Up: At [facility], you and your health needs are our priority.  As part of our continuing mission to provide you with exceptional heart care, we have created designated Provider Care Teams.  These Care Teams include your primary Cardiologist (physician) and Advanced Practice Providers (APPs -  Physician Assistants and Nurse Practitioners) who all work together to provide you with the care you need, when you need it.  We recommend signing up for the patient portal called "MyChart".  Sign up information is provided on this After Visit Summary.  MyChart is used to connect with patients for Virtual Visits (Telemedicine).  Patients are able to view lab/test results, encounter notes, upcoming appointments, etc.  Non-urgent messages can be sent to your provider as well.   To learn more about what you can do with MyChart, go to [email or web address].    Your next appointment:   3-4 month(s)  The format for your next appointment:   In Person  Provider:   K. Mali Hilty, MD   Other Instructions

## 2021-01-13 ENCOUNTER — Telehealth: Payer: Self-pay | Admitting: Internal Medicine

## 2021-01-13 NOTE — Telephone Encounter (Signed)
[place] has approved tier change/exception for zetia to a tier 1 drug until 10/23/2021

## 2021-02-04 ENCOUNTER — Other Ambulatory Visit: Payer: Self-pay

## 2021-02-04 ENCOUNTER — Encounter: Payer: Self-pay | Admitting: Gastroenterology

## 2021-02-04 ENCOUNTER — Ambulatory Visit: Payer: Medicare HMO | Admitting: Gastroenterology

## 2021-02-04 VITALS — BP 176/88 | HR 66 | Ht 66.0 in | Wt 112.4 lb

## 2021-02-04 DIAGNOSIS — Z800 Family history of malignant neoplasm of digestive organs: Secondary | ICD-10-CM

## 2021-02-04 DIAGNOSIS — Z1211 Encounter for screening for malignant neoplasm of colon: Secondary | ICD-10-CM | POA: Diagnosis not present

## 2021-02-04 MED ORDER — ONDANSETRON 4 MG PO TBDP: ORAL_TABLET | ORAL | 0 refills | Status: DC

## 2021-02-04 MED ORDER — SUTAB 1479-225-188 MG PO TABS: 24.0000 | ORAL_TABLET | ORAL | 0 refills | Status: DC

## 2021-02-04 NOTE — Patient Instructions (Signed)
START :  Miralax - dissolve 1 capful in at least 8 ounces of water daily 4 days prior to procedure.   Zofran-dissolve 2 tabs under tongue before starting each dose of preparation for colonoscopy.     Your provider has requested that you go to the basement level for lab work July 2022. Press "B" on the elevator. The lab is located at the first door on the left as you exit the elevator.  We have sent the following medications to your pharmacy for you to pick up at your convenience: Zofran, Sutab  You have been scheduled for a colonoscopy. Please follow written instructions given to you at your visit today.  Please pick up your prep supplies at the pharmacy within the next 1-3 days. If you use inhalers (even only as needed), please bring them with you on the day of your procedure.   Due to recent changes in healthcare laws, you may see the results of your imaging and laboratory studies on MyChart before your provider has had a chance to review them.  We understand that in some cases there may be results that are confusing or concerning to you. Not all laboratory results come back in the same time frame and the provider may be waiting for multiple results in order to interpret others.  Please give [place] 48 hours in order for your provider to thoroughly review all the results before contacting the office for clarification of your results.   Thank you for choosing me and [facility].  Dr. Rush Landmark

## 2021-02-08 ENCOUNTER — Encounter: Payer: Self-pay | Admitting: Gastroenterology

## 2021-02-08 DIAGNOSIS — Z800 Family history of malignant neoplasm of digestive organs: Secondary | ICD-10-CM | POA: Insufficient documentation

## 2021-02-08 DIAGNOSIS — Z1211 Encounter for screening for malignant neoplasm of colon: Secondary | ICD-10-CM | POA: Insufficient documentation

## 2021-02-08 NOTE — Progress Notes (Signed)
[place] VISIT   Primary Care Provider Biagio Borg, MD [place] [address] [phone]  Referring Provider Biagio Borg, MD [address] [place],  [place] [address] [phone]  Patient Profile: Jennifer Andrade is a 70 y.o. female with a pmh significant for hypertension, hyperlipidemia, hypothyroidism, osteopenia, chronic renal insufficiency, vitamin D deficiency, family history possible Colon cancer vs SB cancer (new diagnosis within the last few weeks with mother- no tissue just based on imaging).  The patient presents to the [facility] for an evaluation and management of problem(s) noted below:  Problem List 1. Colon cancer screening   2. Family history of colon cancer     History of Present Illness This is the patient's first visit to the outpatient [place] clinic.  She was set up for a direct colonoscopy for colon cancer screening but could not tolerate the preparation night before the procedure.  She asked to have this rescheduled and to have a clinic visit to discuss preparation.  Patient has normal bowel habits on a daily basis.  She has not had significant bleeding per rectum.  She has had a few episodes where she is felt that she may have some hemorrhoids but no significant changes in her bowel habits overall.  She denies any melena.  She has no abdominal pain.  Patient's mother recently was having significant abdominal pain and discomfort and ended up having a cross-sectional CT scan performed that showed a large lesion in the terminal ileum versus ileocecal valve.  Nothing was pursued in regards to work-up of this as a result of the patient's advanced age and dementia but the patient herself was not really sure exactly what the findings meant for which we discussed briefly today.  The patient has never had an upper or lower endoscopy.  GI Review of Systems Positive as above Negative for pyrosis,  dysphagia, odynophagia, nausea, vomiting, pain, change in bowel habits  Review of Systems General: Denies fevers/chills/weight loss unintentionally Cardiovascular: Denies chest pain/palpitations Pulmonary: Denies shortness of breath Gastroenterological: See HPI Genitourinary: Denies darkened urine Hematological: Denies easy bruising/bleeding Endocrine: Denies temperature intolerance Dermatological: Denies jaundice Psychological: Mood is stable   Medications Current Outpatient Medications  Medication Sig Dispense Refill  . Alirocumab (PRALUENT) 150 MG/ML SOAJ Inject 1 Dose into the skin every 14 (fourteen) days. 2 pen 11  . candesartan (ATACAND) 32 MG tablet TAKE 1 TABLET BY MOUTH EVERY DAY 90 tablet 3  . ezetimibe (ZETIA) 10 MG tablet Take 1 tablet (10 mg total) by mouth daily. 90 tablet 3  . hydrocortisone (ANUSOL-HC) 2.5 % rectal cream PLACE 1 APPLICATION RECTALLY 2 (TWO) TIMES DAILY. 30 g 0  . levothyroxine (SYNTHROID) 75 MCG tablet Take 1 tablet (75 mcg total) by mouth daily. Keep follow=up appt for November for future refills 90 tablet 3  . metoprolol succinate (TOPROL-XL) 50 MG 24 hr tablet Take 1 tablet (50 mg total) by mouth daily. Take with or immediately following a meal. 90 tablet 3  . ondansetron (ZOFRAN ODT) 4 MG disintegrating tablet Dissolve 2 tablets under tongue before starting preparation for colonoscopy. 4 tablet 0  . Sodium Sulfate-Mag Sulfate-KCl (SUTAB) [phone] MG TABS Take 24 tablets by mouth as directed. 24 tablet 0   No current facility-administered medications for this visit.    Allergies Allergies  Allergen Reactions  . Atorvastatin     REACTION: myalgias  . Rosuvastatin     REACTION: memory problem  Histories Past Medical History:  Diagnosis Date  . ANEMIA-IRON DEFICIENCY 04/15/2008  . Cataract   . CKD (chronic kidney disease) stage 3, GFR 30-59 ml/min (HCC) 03/21/2019  . Heart murmur    as a child  . HYPERLIPIDEMIA 04/15/2008  .  HYPERTENSION 04/15/2008  . HYPOTHYROIDISM 04/15/2008  . LUMBAR RADICULOPATHY, LEFT 11/24/2009  . Migraine 03/25/2011  . OSTEOPENIA 04/15/2008  . Other specified forms of hearing loss 09/30/2010  . Phlebitis 1974   when pregnant  . SINUSITIS- ACUTE-NOS 09/30/2010  . Vitamin D deficiency 10/2011   level 16   Past Surgical History:  Procedure Laterality Date  . BACK SURGERY    . cataract surg     lens replacement Bil  . tooth removal  02-Feb-2001  . TUBAL LIGATION     Social History   Socioeconomic History  . Marital status: Married    Spouse name: Not on file  . Number of children: 3  . Years of education: Not on file  . Highest education level: Not on file  Occupational History  . 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 Smoking status: Former Smoker    Packs/day: 1.00    Years: 47.00    Pack years: 47.00    Types: Cigarettes    Quit date: 09/28/2016    Years since quitting: 4.3  . Smokeless tobacco: Never Used  . Tobacco comment: had quit for 5 years during that time  Vaping Use  . Vaping Use: Never used  Substance and Sexual Activity  . Alcohol use: Yes    Alcohol/week: 1.0 standard drink    Types: 1 Glasses of wine per week    Comment: rare, less than weekly  . Drug use: No  . Sexual activity: Not Currently    Birth control/protection: Surgical    Comment: 1st intercourse 70 yo--Fewer than 5 partners  Other Topics Concern  . Not on file  Social History Narrative  . Not on file   Social Determinants of Health   Financial Resource Strain: Low Risk   . Difficulty of Paying Living Expenses: Not hard at all  Food Insecurity: No Food Insecurity  . Worried About Charity fundraiser in the Last Year: Never true  . Ran Out of Food in the Last Year: Never true  Transportation Needs: No Transportation Needs  . Lack of Transportation (Medical): No  . Lack of Transportation (Non-Medical): No  Physical  Activity: Sufficiently Active  . Days of Exercise per Week: 5 days  . Minutes of Exercise per Session: 30 min  Stress: No Stress Concern Present  . Feeling of Stress : Not at all  Social Connections: Socially Integrated  . Frequency of Communication with Friends and Family: More than three times a week  . Frequency of Social Gatherings with Friends and Family: More than three times a week  . Attends Religious Services: More than 4 times per year  . Active Member of Clubs or Organizations: Yes  . Attends Archivist Meetings: More than 4 times per year  . Marital Status: Married  Human resources officer Violence: Not on file   Family History  Problem Relation Age of Onset  . Heart disease Father   . Kidney disease Father   . Hypertension Father   . Thyroid disease Mother   . Colon cancer Mother   . Esophageal cancer Neg Hx   . Rectal cancer Neg  Hx   . Stomach cancer Neg Hx   . Inflammatory bowel disease Neg Hx   . Liver disease Neg Hx   . Pancreatic cancer Neg Hx    I have reviewed her medical, social, and family history in detail and updated the electronic medical record as necessary.    PHYSICAL EXAMINATION  BP (!) 176/88 (BP Location: Right Arm, Patient Position: Sitting, Cuff Size: Normal)   Pulse 66   Ht 5\' 6"  (1.676 m)   Wt 112 lb 6 oz (51 kg)   LMP 10/24/2001   BMI 18.14 kg/m  Wt Readings from Last 3 Encounters:  02/04/21 112 lb 6 oz (51 kg)  01/11/21 112 lb 12.8 oz (51.2 kg)  12/14/20 113 lb (51.3 kg)  GEN: NAD, appears stated age, doesn't appear chronically ill PSYCH: Cooperative, without pressured speech EYE: Conjunctivae pink, sclerae anicteric ENT: Masked CV: Nontachycardic RESP: No audible wheezing GI: NABS, soft, NT/ND, without rebound or guarding MSK/EXT: No lower extremity edema SKIN: No jaundice NEURO:  Alert & Oriented x 3, no focal deficits   REVIEW OF DATA  I reviewed the following data at the time of this encounter:  GI Procedures and  Studies  No relevant studies to review  Laboratory Studies  Reviewed those in epic  Imaging Studies  No relevant studies to review   ASSESSMENT  Ms. Butler is a 70 y.o. female with a pmh significant for hypertension, hyperlipidemia, hypothyroidism, osteopenia, chronic renal insufficiency, vitamin D deficiency, family history possible Colon cancer vs SB cancer (new diagnosis within the last few weeks with mother- no tissue just based on imaging).  The patient is seen today for evaluation and management of:  1. Colon cancer screening   2. Family history of colon cancer    The patient is clinically and hemodynamically stable.  Patient will benefit from colon cancer screening.  We initially discussed other preparations versus Cologuard testing.  However, during our interview, the patient showed me recent CT imaging report of her mother which shows that she has a terminal ileal versus ileocecal valve mass/lesion that is leading to potential obstruction in her mother.  This is very concerning for distal small bowel versus colon cancer as an etiology of symptoms.  From the patient standpoint, this caused [place] to decide not to move forward with Cologuard testing because of the potential high risk nature of the family history that is present.  We will adjust her preparation slightly in an effort of trying to have her tolerate the preparation more easily.  If we fail with preparation then will consider Cologuard testing understanding that she is not the typical person that I would normally recommend a Cologuard testing but we cannot get her cleaned out appropriately and safely and then will have to keep this in mind.  The risks and benefits of endoscopic evaluation were discussed with the patient; these include but are not limited to the risk of perforation, infection, bleeding, missed lesions, lack of diagnosis, severe illness requiring hospitalization, as well as anesthesia and sedation related illnesses.  The  patient is agreeable to proceed.  All patient questions were answered to the best of my ability, and the patient agrees to the aforementioned plan of action with follow-up as indicated.  I wish her the best that she works through the health of her elderly mother.   PLAN  Laboratories to ensure no evidence of iron deficiency anemia persists which will be performed at a follow-up visit with cardiology Proceed with scheduling  colonoscopy for screening purposes -If patient has evidence of iron deficiency anemia that is found then she will need an upper endoscopy at some point as well -Colonoscopy preparation as per patient instructions -Zofran available if needed prior to preparation and [facility] preparation   Orders Placed This Encounter  Procedures  . CBC  . IBC + Ferritin  . Ambulatory referral to Gastroenterology    New Prescriptions   ONDANSETRON (ZOFRAN ODT) 4 MG DISINTEGRATING TABLET    Dissolve 2 tablets under tongue before starting preparation for colonoscopy.   SODIUM SULFATE-MAG SULFATE-KCL (SUTAB) [phone] MG TABS    Take 24 tablets by mouth as directed.   Modified Medications   No medications on file    Planned Follow Up No follow-ups on file.   Total Time in Face-to-Face and in Coordination of Care for patient including independent/personal interpretation/review of prior testing, medical history, examination, medication adjustment, communicating results with the patient directly, and documentation with the EHR is 35 minutes.   Justice Britain, MD [facility] Office # [phone]

## 2021-02-14 ENCOUNTER — Other Ambulatory Visit: Payer: Self-pay | Admitting: Internal Medicine

## 2021-04-01 ENCOUNTER — Encounter: Payer: Medicare HMO | Admitting: Gastroenterology

## 2021-04-13 ENCOUNTER — Encounter: Payer: Self-pay | Admitting: Gastroenterology

## 2021-04-23 ENCOUNTER — Ambulatory Visit: Payer: Medicare HMO | Admitting: Internal Medicine

## 2021-04-29 ENCOUNTER — Ambulatory Visit: Payer: Medicare HMO | Admitting: Internal Medicine

## 2021-06-17 DIAGNOSIS — L578 Other skin changes due to chronic exposure to nonionizing radiation: Secondary | ICD-10-CM | POA: Diagnosis not present

## 2021-06-17 DIAGNOSIS — E7849 Other hyperlipidemia: Secondary | ICD-10-CM | POA: Diagnosis not present

## 2021-06-17 DIAGNOSIS — E039 Hypothyroidism, unspecified: Secondary | ICD-10-CM | POA: Diagnosis not present

## 2021-06-17 DIAGNOSIS — L219 Seborrheic dermatitis, unspecified: Secondary | ICD-10-CM | POA: Diagnosis not present

## 2021-06-17 DIAGNOSIS — L821 Other seborrheic keratosis: Secondary | ICD-10-CM | POA: Diagnosis not present

## 2021-06-17 DIAGNOSIS — L570 Actinic keratosis: Secondary | ICD-10-CM | POA: Diagnosis not present

## 2021-06-17 DIAGNOSIS — D225 Melanocytic nevi of trunk: Secondary | ICD-10-CM | POA: Diagnosis not present

## 2021-06-17 DIAGNOSIS — D2230 Melanocytic nevi of unspecified part of face: Secondary | ICD-10-CM | POA: Diagnosis not present

## 2021-06-17 LAB — CBC
Hematocrit: 40.9 % (ref 34.0–46.6)
Hemoglobin: 13.2 g/dL (ref 11.1–15.9)
MCH: 30.4 pg (ref 26.6–33.0)
MCHC: 32.3 g/dL (ref 31.5–35.7)
MCV: 94 fL (ref 79–97)
Platelets: 207 10*3/uL (ref 150–450)
RBC: 4.34 x10E6/uL (ref 3.77–5.28)
RDW: 12.3 % (ref 11.7–15.4)
WBC: 7 10*3/uL (ref 3.4–10.8)

## 2021-06-17 LAB — COMPREHENSIVE METABOLIC PANEL
ALT: 11 IU/L (ref 0–32)
AST: 18 IU/L (ref 0–40)
Albumin/Globulin Ratio: 1.9 (ref 1.2–2.2)
Albumin: 4.9 g/dL — ABNORMAL HIGH (ref 3.8–4.8)
Alkaline Phosphatase: 49 IU/L (ref 44–121)
BUN/Creatinine Ratio: 32 — ABNORMAL HIGH (ref 12–28)
BUN: 33 mg/dL — ABNORMAL HIGH (ref 8–27)
Bilirubin Total: 0.4 mg/dL (ref 0.0–1.2)
CO2: 24 mmol/L (ref 20–29)
Calcium: 10.6 mg/dL — ABNORMAL HIGH (ref 8.7–10.3)
Chloride: 103 mmol/L (ref 96–106)
Creatinine, Ser: 1.02 mg/dL — ABNORMAL HIGH (ref 0.57–1.00)
Globulin, Total: 2.6 g/dL (ref 1.5–4.5)
Glucose: 88 mg/dL (ref 65–99)
Potassium: 5.2 mmol/L (ref 3.5–5.2)
Sodium: 142 mmol/L (ref 134–144)
Total Protein: 7.5 g/dL (ref 6.0–8.5)
eGFR: 60 mL/min/{1.73_m2} (ref >59)

## 2021-06-17 LAB — LIPID PANEL
Chol/HDL Ratio: 2.5 ratio (ref 0.0–4.4)
Cholesterol, Total: 151 mg/dL (ref 100–199)
HDL: 61 mg/dL (ref >39)
LDL Chol Calc (NIH): 70 mg/dL (ref 0–99)
Triglycerides: 114 mg/dL (ref 0–149)
VLDL Cholesterol Cal: 20 mg/dL (ref 5–40)

## 2021-06-17 LAB — TSH: TSH: 0.024 u[IU]/mL — ABNORMAL LOW (ref 0.450–4.500)

## 2021-06-23 NOTE — Progress Notes (Signed)
Cardiology Clinic Note   Patient Name: Jennifer Andrade Date of Encounter: 06/29/2021  Primary Care Provider:  Biagio Borg, MD Primary Cardiologist:  Pixie Casino, MD  Patient Profile    Jennifer Andrade presents to the clinic today for follow-up evaluation of her hyperlipidemia.  Past Medical History    Past Medical History:  Diagnosis Date   ANEMIA-IRON DEFICIENCY 04/15/2008   Cataract    CKD (chronic kidney disease) stage 3, GFR 30-59 ml/min (HCC) 03/21/2019   Heart murmur    as a child   HYPERLIPIDEMIA 04/15/2008   HYPERTENSION 04/15/2008   HYPOTHYROIDISM 04/15/2008   LUMBAR RADICULOPATHY, LEFT 11/24/2009   Migraine 03/25/2011   OSTEOPENIA 04/15/2008   Other specified forms of hearing loss 09/30/2010   Phlebitis 1974   when pregnant   SINUSITIS- ACUTE-NOS 09/30/2010   Vitamin D deficiency 10/2011   level 16   Past Surgical History:  Procedure Laterality Date   BACK SURGERY     cataract surg     lens replacement Bil   tooth removal  2002   TUBAL LIGATION      Allergies  Allergies  Allergen Reactions   Atorvastatin     REACTION: myalgias   Rosuvastatin     REACTION: memory problem    History of Present Illness    Jennifer Andrade has a PMH of familial hyperlipidemia, hypothyroidism, bilateral carotid artery disease, iron deficiency anemia, CKD stage III, hypertension, and statin intolerance.  She was initially seen by Dr. Debara Pickett for an evaluation of her dyslipidemia by her PCP Dr. Jenny Reichmann.  Her lipid panel at that time showed a total cholesterol of 362 with an LDL cholesterol of 295 and triglycerides of 147.  She reported longstanding elevated cholesterol.  Her BMI was only 18.  She reported a strong family history of heart disease including her father who had 3 MIs and had CABG in his 23s.  She also reported 2 half sisters with coronary disease.  She was noted to have bilateral carotid artery disease.  She was not able to tolerate statins due to myalgias and mental  status changes.  She followed up 08/12/2020.  During that time she had excellent response to Praluent.  Her lipid panel showed a total cholesterol of 219, LDL cholesterol of 142 and triglycerides of 116.  She reported no side effects with the PCSK9 inhibitor.  She can followed up 01/11/2021.  During that time her lipid panel continue to improve.  Her LDL cholesterol was down to 126.  It was felt that further reduction of her cholesterol would be beneficial due to her carotid artery burden.  She reported that in the past she was tolerant of Zetia but has stopped taking the medication due to cost.  Since the time she had taken the medication and had become generic.  She was started on Zetia 10 mg daily.  She presents to the clinic today for follow-up evaluation states she feels well.  She is tolerating her Praluent and her ezetimibe well.  We reviewed her most recent lipid panel which shows her LDL is significantly improved and is now at 70.  On exam she was noted to have a systolic murmur and carotid bruit.  She reports that her murmur has been present since she was a child.  She remains very physically active and denies increased work of breathing normal physical activities but does notice increased work of breathing with increased physical activity.  I will order a carotid  duplex ultrasound, continue her current medication, and have her follow-up in 9-12 months.  Today she denies chest pain, shortness of breath, lower extremity edema, fatigue, palpitations, melena, hematuria, hemoptysis, diaphoresis, weakness, presyncope, syncope, orthopnea, and PND.   Home Medications    Prior to Admission medications   Medication Sig Start Date End Date Taking? Authorizing Provider  PRALUENT 150 MG/ML SOAJ INJECT 1 DOSE INTO THE SKIN EVERY 14 (FOURTEEN) DAYS. 02/15/21   Pixie Casino, MD  candesartan (ATACAND) 32 MG tablet TAKE 1 TABLET BY MOUTH EVERY DAY 09/11/20   Biagio Borg, MD  ezetimibe (ZETIA) 10 MG  tablet Take 1 tablet (10 mg total) by mouth daily. 01/11/21 04/11/21  Hilty, Nadean Corwin, MD  hydrocortisone (ANUSOL-HC) 2.5 % rectal cream PLACE 1 APPLICATION RECTALLY 2 (TWO) TIMES DAILY. 11/10/20   Biagio Borg, MD  levothyroxine (SYNTHROID) 75 MCG tablet Take 1 tablet (75 mcg total) by mouth daily. Keep follow=up appt for November for future refills 09/11/20   Biagio Borg, MD  metoprolol succinate (TOPROL-XL) 50 MG 24 hr tablet Take 1 tablet (50 mg total) by mouth daily. Take with or immediately following a meal. 09/11/20   Biagio Borg, MD  ondansetron (ZOFRAN ODT) 4 MG disintegrating tablet Dissolve 2 tablets under tongue before starting preparation for colonoscopy. 02/04/21   Mansouraty, Telford Nab., MD  Sodium Sulfate-Mag Sulfate-KCl (SUTAB) [phone] MG TABS Take 24 tablets by mouth as directed. 02/04/21   Mansouraty, Telford Nab., MD    Family History    Family History  Problem Relation Age of Onset   Heart disease Father    Kidney disease Father    Hypertension Father    Thyroid disease Mother    Colon cancer Mother    Esophageal cancer Neg Hx    Rectal cancer Neg Hx    Stomach cancer Neg Hx    Inflammatory bowel disease Neg Hx    Liver disease Neg Hx    Pancreatic cancer Neg Hx    She indicated that her mother is alive. She indicated that her father is deceased. She indicated that the status of her neg hx is unknown. She indicated that the status of her other is unknown.  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    Employer: UNEMPLOYED  Tobacco Use   Smoking status: Former    Packs/day: 1.00    Years: 47.00    Pack years: 47.00    Types: Cigarettes    Quit date: 09/28/2016    Years since quitting:  4.7   Smokeless tobacco: Never   Tobacco comments:    had quit for 5 years during that time  Vaping Use   Vaping Use: Never used  Substance and Sexual Activity   Alcohol use: Yes    Alcohol/week: 1.0 standard drink    Types: 1 Glasses of wine per week    Comment: rare, less than weekly   Drug use: No   Sexual activity: Not Currently    Birth control/protection: Surgical    Comment: 1st intercourse 70 yo--Fewer than 5 partners  Other Topics Concern   Not on file  Social History Narrative   Not  on file   Social Determinants of Health   Financial Resource Strain: Low Risk    Difficulty of Paying Living Expenses: Not hard at all  Food Insecurity: No Food Insecurity   Worried About Charity fundraiser in the Last Year: Never true   Arboriculturist in the Last Year: Never true  Transportation Needs: No Transportation Needs   Lack of Transportation (Medical): No   Lack of Transportation (Non-Medical): No  Physical Activity: Sufficiently Active   Days of Exercise per Week: 5 days   Minutes of Exercise per Session: 30 min  Stress: No Stress Concern Present   Feeling of Stress : Not at all  Social Connections: Socially Integrated   Frequency of Communication with Friends and Family: More than three times a week   Frequency of Social Gatherings with Friends and Family: More than three times a week   Attends Religious Services: More than 4 times per year   Active Member of [employer] or Organizations: Yes   Attends Music therapist: More than 4 times per year   Marital Status: Married  Human resources officer Violence: Not on file     Review of Systems    General:  No chills, fever, night sweats or weight changes.  Cardiovascular:  No chest pain, dyspnea on exertion, edema, orthopnea, palpitations, paroxysmal nocturnal dyspnea. Dermatological: No rash, lesions/masses Respiratory: No cough, dyspnea Urologic: No hematuria, dysuria Abdominal:   No nausea, vomiting, diarrhea,  bright red blood per rectum, melena, or hematemesis Neurologic:  No visual changes, wkns, changes in mental status. All other systems reviewed and are otherwise negative except as noted above.  Physical Exam    VS:  BP (!) 152/88 (BP Location: Left Arm, Patient Position: Sitting, Cuff Size: Normal)   Pulse 88   Ht '5\' 6"'$  (1.676 m)   Wt 112 lb 9.6 oz (51.1 kg)   LMP 10/24/2001   SpO2 94%   BMI 18.17 kg/m  , BMI Body mass index is 18.17 kg/m. GEN: Well nourished, well developed, in no acute distress. HEENT: normal. Neck: Supple, no JVD, carotid bruits, or masses. Cardiac: RRR, systolic murmur [id number] heard along right sternal border, rubs, or gallops. No clubbing, cyanosis, edema.  Radials/DP/PT 2+ and equal bilaterally.  Bilateral carotid bruit Respiratory:  Respirations regular and unlabored, clear to auscultation bilaterally. GI: Soft, nontender, nondistended, BS + x 4. MS: no deformity or atrophy. Skin: warm and dry, no rash. Neuro:  Strength and sensation are intact. Psych: Normal affect.  Accessory Clinical Findings    Recent Labs: 06/17/2021: ALT 11; BUN 33; Creatinine, Ser 1.02; Hemoglobin 13.2; Platelets 207; Potassium 5.2; Sodium 142; TSH 0.024   Recent Lipid Panel    Component Value Date/Time   CHOL 151 06/17/2021 1020   TRIG 114 06/17/2021 1020   HDL 61 06/17/2021 1020   CHOLHDL 2.5 06/17/2021 1020   CHOLHDL 8 03/19/2019 1001   VLDL 29.4 03/19/2019 1001   LDLCALC 70 06/17/2021 1020   LDLDIRECT 195.2 03/22/2011 0759    ECG personally reviewed by me today-none today.  EKG 01/21/2015 Sinus rhythm 76 bpm   Assessment & Plan   1.  Probable familial hyperlipidemia/family history of heart disease-06/17/2021: Cholesterol, Total 151; HDL 61; LDL Chol Calc (NIH) 70; Triglycerides 114.  She is statin intolerant and noted myalgias and mental status changes with atorvastatin and rosuvastatin. Continue Praluent, ezetimibe Heart healthy low-sodium high-fiber diet Increase  physical activity as tolerated  Bilateral carotid artery disease-denies presyncope, syncope, lightheadedness,  vision changes, and headaches.  Bilateral carotid bruit noted carotid duplex 04/08/2013 showed mild left and mild to moderate right irregular atherosclerosis plaque and less than 50% bilateral ICA. Continue Praluent, ezetimibe Heart healthy low-sodium high-fiber diet Increase physical activity as tolerated Repeat carotid duplex  CKD stage III-creatinine 1.02 on 06/17/2021. Follows with PCP  Disposition: Follow-up with Dr. Debara Pickett in 9-12 months.  Jossie Ng. Kameka Whan NP-C    06/29/2021, 9:06 AM [place] [address] Office [phone] Fax [phone]  Notice: This dictation was prepared with Dragon dictation along with smaller phrase technology. Any transcriptional errors that result from this process are unintentional and may not be corrected upon review.  I spent 13 minutes examining this patient, reviewing medications, and using patient centered shared decision making involving her cardiac care.  Prior to her visit I spent greater than 20 minutes reviewing her past medical history,  medications, and prior cardiac tests.

## 2021-06-29 ENCOUNTER — Encounter: Payer: Self-pay | Admitting: General Practice

## 2021-06-29 ENCOUNTER — Ambulatory Visit: Payer: Medicare HMO | Admitting: General Practice

## 2021-06-29 ENCOUNTER — Other Ambulatory Visit: Payer: Self-pay

## 2021-06-29 VITALS — BP 152/88 | HR 88 | Ht 66.0 in | Wt 112.6 lb

## 2021-06-29 DIAGNOSIS — I779 Disorder of arteries and arterioles, unspecified: Secondary | ICD-10-CM

## 2021-06-29 DIAGNOSIS — N183 Chronic kidney disease, stage 3 unspecified: Secondary | ICD-10-CM

## 2021-06-29 DIAGNOSIS — E7849 Other hyperlipidemia: Secondary | ICD-10-CM | POA: Diagnosis not present

## 2021-06-29 NOTE — Patient Instructions (Signed)
Medication Instructions:  The current medical regimen is effective;  continue present plan and medications as directed. Please refer to the Current Medication list given to you today.   *If you need a refill on your cardiac medications before your next appointment, please call your pharmacy*  Lab Work:    NONE      Testing/Procedures:  Your physician has requested that you have a carotid duplex. This test is an ultrasound of the carotid arteries in your neck. It looks at blood flow through these arteries that supply the brain with blood. Allow one hour for this exam. There are no restrictions or special instructions.  Follow-Up: Your next appointment:  9-12 month(s) In Person with You may see Pixie Casino, MDJESSE CLEAVER, FNP-C or one of the following Advanced Practice Providers on your designated Care Team:  Almyra Deforest, PA-C Fabian Sharp, [place] or Roby Lofts, PA-C   Please call our office 2 months in advance to schedule this appointment   At [facility], you and your health needs are our priority.  As part of our continuing mission to provide you with exceptional heart care, we have created designated Provider Care Teams.  These Care Teams include your primary Cardiologist (physician) and Advanced Practice Providers (APPs -  Physician Assistants and Nurse Practitioners) who all work together to provide you with the care you need, when you need it.

## 2021-07-08 ENCOUNTER — Ambulatory Visit (HOSPITAL_COMMUNITY)
Admission: RE | Admit: 2021-07-08 | Discharge: 2021-07-08 | Disposition: A | Discharge status: Home / Self Care | Payer: Medicare HMO | Source: Ambulatory Visit | Attending: Cardiology | Admitting: Cardiology

## 2021-07-08 ENCOUNTER — Other Ambulatory Visit: Payer: Self-pay

## 2021-07-08 ENCOUNTER — Other Ambulatory Visit (HOSPITAL_COMMUNITY): Payer: Self-pay | Admitting: General Practice

## 2021-07-08 DIAGNOSIS — I6523 Occlusion and stenosis of bilateral carotid arteries: Secondary | ICD-10-CM | POA: Diagnosis not present

## 2021-07-08 DIAGNOSIS — I779 Disorder of arteries and arterioles, unspecified: Secondary | ICD-10-CM | POA: Diagnosis not present

## 2021-07-08 DIAGNOSIS — G458 Other transient cerebral ischemic attacks and related syndromes: Secondary | ICD-10-CM

## 2021-07-14 ENCOUNTER — Other Ambulatory Visit: Payer: Self-pay

## 2021-09-13 ENCOUNTER — Other Ambulatory Visit: Payer: Self-pay

## 2021-09-13 ENCOUNTER — Encounter: Payer: Self-pay | Admitting: Internal Medicine

## 2021-09-13 ENCOUNTER — Ambulatory Visit (INDEPENDENT_AMBULATORY_CARE_PROVIDER_SITE_OTHER): Payer: Medicare HMO | Admitting: Internal Medicine

## 2021-09-13 VITALS — BP 178/90 | HR 68 | Resp 16 | Ht 66.0 in | Wt 111.4 lb

## 2021-09-13 DIAGNOSIS — Z0001 Encounter for general adult medical examination with abnormal findings: Secondary | ICD-10-CM | POA: Diagnosis not present

## 2021-09-13 DIAGNOSIS — I10 Essential (primary) hypertension: Secondary | ICD-10-CM

## 2021-09-13 DIAGNOSIS — E039 Hypothyroidism, unspecified: Secondary | ICD-10-CM | POA: Diagnosis not present

## 2021-09-13 DIAGNOSIS — E559 Vitamin D deficiency, unspecified: Secondary | ICD-10-CM

## 2021-09-13 DIAGNOSIS — E7800 Pure hypercholesterolemia, unspecified: Secondary | ICD-10-CM | POA: Diagnosis not present

## 2021-09-13 DIAGNOSIS — N1831 Chronic kidney disease, stage 3a: Secondary | ICD-10-CM | POA: Diagnosis not present

## 2021-09-13 DIAGNOSIS — Z0000 Encounter for general adult medical examination without abnormal findings: Secondary | ICD-10-CM

## 2021-09-13 MED ORDER — CHOLECALCIFEROL 50 MCG (2000 UT) PO TABS: ORAL_TABLET | ORAL | 99 refills | Status: AC

## 2021-09-13 MED ORDER — OLMESARTAN MEDOXOMIL 40 MG PO TABS: 40.0000 mg | ORAL_TABLET | Freq: Every day | ORAL | 3 refills | Status: DC

## 2021-09-13 MED ORDER — LEVOTHYROXINE SODIUM 50 MCG PO TABS: 50.0000 ug | ORAL_TABLET | Freq: Every day | ORAL | 3 refills | Status: DC

## 2021-09-13 MED ORDER — AMLODIPINE BESYLATE 5 MG PO TABS: 5.0000 mg | ORAL_TABLET | Freq: Every day | ORAL | 3 refills | Status: DC

## 2021-09-13 NOTE — Patient Instructions (Signed)
Please remember to call for your colonoscopy and mammogram as we discussed given you mother's situation  Ok to STOP the levothyroxine 75 mcg per day, and the candesartan  Please take all new medication as prescribed - the levothyroxine 50 mcg per day, AND the generic Benicar 40 mg per day  Please take OTC Vitamin D3 at 2000 units per day, indefinitely  Please continue all other medications as before, and refills have been done if requested.  Please have the pharmacy call with any other refills you may need.  Please continue your efforts at being more active, low cholesterol diet, and weight control.  You are otherwise up to date with prevention measures today.  Please keep your appointments with your specialists as you may have planned  Please make an Appointment to return in 1 months, or sooner if needed, also with Lab testing done a few days ahead at the [facility] (no appointment needed)

## 2021-09-13 NOTE — Progress Notes (Signed)
Patient ID: Jennifer Andrade, female   DOB: [birth year], 70 y.o.   MRN: [id number]        Chief Complaint:: wellness exam and htn, hld, hypothyoridism       HPI:  Jennifer Andrade is a 70 y.o. female here for wellness exam, declines covid booster, shingrix, flu shot, pneumovax, mammogram, dxa, colonscopy, tdap for now; o/w up to date                        Also Pt denies chest pain, increased sob or doe, wheezing, orthopnea, PND, increased LE swelling, palpitations, dizziness or syncope, but BP has been mildly elevated at home recent as well, and pt is ok with med change.  Also has hx of hld and known caortid dzz, goal ldl < 70, has not been taking statin but willing to restart and check LAB in 4 wks; Also had TSH mild low approx 6 mo ago, decided to wait untl today to address.  Denies hyper or hypo thyroid symptoms such as voice, skin or hair change.  Not taking Vit D.   Pt denies polydipsia, polyuria, or new focal neuro s/s. Overall good compliance with meds.  [facility] for colonscopy but only after jan 1, pt states also will call for own mammogram.  No other new compaints Wt Readings from Last 3 Encounters:  09/13/21 111 lb 6.4 oz (50.5 kg)  06/29/21 112 lb 9.6 oz (51.1 kg)  02/04/21 112 lb 6 oz (51 kg)   BP Readings from Last 3 Encounters:  09/13/21 (!) 178/90  06/29/21 (!) 152/88  02/04/21 (!) 176/88   Immunization History  Administered Date(s) Administered   PFIZER(Purple Top)SARS-COV-2 Vaccination 01/06/2020, 01/27/2020   There are no preventive care reminders to display for this patient.     Past Medical History:  Diagnosis Date   ANEMIA-IRON DEFICIENCY 04/15/2008   Cataract    CKD (chronic kidney disease) stage 3, GFR 30-59 ml/min (HCC) 03/21/2019   Heart murmur    as a child   HYPERLIPIDEMIA 04/15/2008   HYPERTENSION 04/15/2008   HYPOTHYROIDISM 04/15/2008   LUMBAR RADICULOPATHY, LEFT 11/24/2009   Migraine 03/25/2011   OSTEOPENIA 04/15/2008   Other specified forms of hearing loss 09/30/2010    Phlebitis 1974   when pregnant   SINUSITIS- ACUTE-NOS 09/30/2010   Vitamin D deficiency 10/2011   level 16   Past Surgical History:  Procedure Laterality Date   BACK SURGERY     cataract surg     lens replacement Bil   tooth removal  2002   TUBAL LIGATION      reports that she quit smoking about 4 years ago. Her smoking use included cigarettes. She has a 47.00 pack-year smoking history. She has never used smokeless tobacco. She reports current alcohol use of about 1.0 standard drink per week. She reports that she does not use drugs. family history includes Colon cancer in her mother; Heart disease in her father; Hypertension in her father; Kidney disease in her father; Thyroid disease in her mother. Allergies  Allergen Reactions   Atorvastatin     REACTION: myalgias   Rosuvastatin     REACTION: memory problem   Current Outpatient Medications on File Prior to Visit  Medication Sig Dispense Refill   hydrocortisone (ANUSOL-HC) 2.5 % rectal cream PLACE 1 APPLICATION RECTALLY 2 (TWO) TIMES DAILY. 30 g 0   metoprolol succinate (TOPROL-XL) 50 MG 24 hr tablet Take 1 tablet (50 mg total) by mouth daily.  Take with or immediately following a meal. 90 tablet 3   PRALUENT 150 MG/ML SOAJ INJECT 1 DOSE INTO THE SKIN EVERY 14 (FOURTEEN) DAYS. 2 mL 11   ezetimibe (ZETIA) 10 MG tablet Take 1 tablet (10 mg total) by mouth daily. 90 tablet 3   No current facility-administered medications on file prior to visit.        ROS:  All others reviewed and negative.  Objective        PE:  BP (!) 178/90 (BP Location: Right Arm, Patient Position: Sitting, Cuff Size: Normal)   Pulse 68   Resp 16   Ht 5\' 6"  (1.676 m)   Wt 111 lb 6.4 oz (50.5 kg)   LMP 10/24/2001   BMI 17.98 kg/m                 Constitutional: Pt appears in NAD               HENT: Head: NCAT.                Right Ear: External ear normal.                 Left Ear: External ear normal.                Eyes: . Pupils are equal, round,  and reactive to light. Conjunctivae and EOM are normal               Nose: without d/c or deformity               Neck: Neck supple. Gross normal ROM               Cardiovascular: Normal rate and regular rhythm.                 Pulmonary/Chest: Effort normal and breath sounds without rales or wheezing.                Abd:  Soft, NT, ND, + BS, no organomegaly               Neurological: Pt is alert. At baseline orientation, motor grossly intact               Skin: Skin is warm. No rashes, no other new lesions, LE edema - none               Psychiatric: Pt behavior is normal without agitation   Micro: none  Cardiac tracings I have personally interpreted today:  none  Pertinent Radiological findings (summarize): none   Lab Results  Component Value Date   WBC 7.0 06/17/2021   HGB 13.2 06/17/2021   HCT 40.9 06/17/2021   PLT 207 06/17/2021   GLUCOSE 88 06/17/2021   CHOL 151 06/17/2021   TRIG 114 06/17/2021   HDL 61 06/17/2021   LDLDIRECT 195.2 03/22/2011   LDLCALC 70 06/17/2021   ALT 11 06/17/2021   AST 18 06/17/2021   NA 142 06/17/2021   K 5.2 06/17/2021   CL 103 06/17/2021   CREATININE 1.02 (H) 06/17/2021   BUN 33 (H) 06/17/2021   CO2 24 06/17/2021   TSH 0.024 (L) 06/17/2021   Assessment/Plan:  Jennifer Andrade is a 70 y.o. White or Caucasian [1] female with  has a past medical history of ANEMIA-IRON DEFICIENCY (04/15/2008), Cataract, CKD (chronic kidney disease) stage 3, GFR 30-59 ml/min (HCC) (03/21/2019), Heart murmur, HYPERLIPIDEMIA (04/15/2008), HYPERTENSION (04/15/2008), HYPOTHYROIDISM (04/15/2008), LUMBAR RADICULOPATHY, LEFT (11/24/2009), Migraine (  03/25/2011), OSTEOPENIA (04/15/2008), Other specified forms of hearing loss (09/30/2010), Phlebitis (1974), SINUSITIS- ACUTE-NOS (09/30/2010), and Vitamin D deficiency (10/2011).  Hypothyroidism Lab Results  Component Value Date   TSH 0.024 (L) 06/17/2021   Overcontrolled, ok to decrease the leothyroxine to 50 mcg per day, and f/u lab  testing 4 wks  Hyperlipidemia Lab Results  Component Value Date   LDLCALC 70 06/17/2021   Mild uncontrolled, goal ldl < 70, pt to continue current zetia 10 mg and lower chol diet   Essential hypertension BP Readings from Last 3 Encounters:  09/13/21 (!) 178/90  06/29/21 (!) 152/88  02/04/21 (!) 176/88   Chronic uncontrolled, pt to continue medical treatment toprol, but cchange candesartan to benicar 40, and add amlodipien 5 qd with f/u rov 4 wks   Vitamin D deficiency Last vitamin D Lab Results  Component Value Date   VD25OH 14.89 (L) 09/09/2020   Low, to start oral replacement   CKD (chronic kidney disease) stage 3, GFR 30-59 ml/min (HCC) Lab Results  Component Value Date   CREATININE 1.02 (H) 06/17/2021   Stable overall, cont to avoid nephrotoxins'  Encounter for well adult exam with abnormal findings Age and sex appropriate education and counseling updated with regular exercise and diet Referrals for preventative services - for colonscopy after jan 1, pt to call for mammogram, declines dxa Immunizations addressed - declines all including covid booster, shingrix, flu, pneumovax, tdap Smoking counseling  - none needed Evidence for depression or other mood disorder - chronic stable anxiety Most recent labs reviewed. I have personally reviewed and have noted: 1) the patient's medical and social history 2) The patient's current medications and supplements 3) The patient's height, weight, and BMI have been recorded in the chart  Followup: Return in about 4 weeks (around 10/11/2021).  Cathlean Cower, MD 09/15/2021 9:22 PM [facility]

## 2021-09-15 ENCOUNTER — Encounter: Payer: Self-pay | Admitting: Internal Medicine

## 2021-09-15 NOTE — Assessment & Plan Note (Signed)
Lab Results  Component Value Date   CREATININE 1.02 (H) 06/17/2021   Stable overall, cont to avoid nephrotoxins'

## 2021-09-15 NOTE — Assessment & Plan Note (Signed)
Lab Results  Component Value Date   LDLCALC 70 06/17/2021   Mild uncontrolled, goal ldl < 70, pt to continue current zetia 10 mg and lower chol diet

## 2021-09-15 NOTE — Assessment & Plan Note (Signed)
Last vitamin D Lab Results  Component Value Date   VD25OH 14.89 (L) 09/09/2020   Low, to start oral replacement

## 2021-09-15 NOTE — Assessment & Plan Note (Signed)
Age and sex appropriate education and counseling updated with regular exercise and diet Referrals for preventative services - for colonscopy after jan 1, pt to call for mammogram, declines dxa Immunizations addressed - declines all including covid booster, shingrix, flu, pneumovax, tdap Smoking counseling  - none needed Evidence for depression or other mood disorder - chronic stable anxiety Most recent labs reviewed. I have personally reviewed and have noted: 1) the patient's medical and social history 2) The patient's current medications and supplements 3) The patient's height, weight, and BMI have been recorded in the chart

## 2021-09-15 NOTE — Assessment & Plan Note (Signed)
BP Readings from Last 3 Encounters:  09/13/21 (!) 178/90  06/29/21 (!) 152/88  02/04/21 (!) 176/88   Chronic uncontrolled, pt to continue medical treatment toprol, but cchange candesartan to benicar 40, and add amlodipien 5 qd with f/u rov 4 wks

## 2021-09-15 NOTE — Assessment & Plan Note (Signed)
Lab Results  Component Value Date   TSH 0.024 (L) 06/17/2021   Overcontrolled, ok to decrease the leothyroxine to 50 mcg per day, and f/u lab testing 4 wks

## 2021-10-10 ENCOUNTER — Other Ambulatory Visit: Payer: Self-pay | Admitting: Internal Medicine

## 2021-10-11 ENCOUNTER — Other Ambulatory Visit: Payer: Self-pay | Admitting: Internal Medicine

## 2021-10-11 DIAGNOSIS — Z1231 Encounter for screening mammogram for malignant neoplasm of breast: Secondary | ICD-10-CM

## 2021-10-19 ENCOUNTER — Ambulatory Visit (INDEPENDENT_AMBULATORY_CARE_PROVIDER_SITE_OTHER): Payer: Medicare HMO | Admitting: Internal Medicine

## 2021-10-19 ENCOUNTER — Encounter: Payer: Self-pay | Admitting: Internal Medicine

## 2021-10-19 ENCOUNTER — Other Ambulatory Visit: Payer: Self-pay

## 2021-10-19 VITALS — BP 148/80 | HR 70 | Temp 97.8°F | Ht 66.0 in | Wt 111.6 lb

## 2021-10-19 DIAGNOSIS — E7800 Pure hypercholesterolemia, unspecified: Secondary | ICD-10-CM | POA: Diagnosis not present

## 2021-10-19 DIAGNOSIS — J069 Acute upper respiratory infection, unspecified: Secondary | ICD-10-CM

## 2021-10-19 DIAGNOSIS — I10 Essential (primary) hypertension: Secondary | ICD-10-CM | POA: Diagnosis not present

## 2021-10-19 DIAGNOSIS — E039 Hypothyroidism, unspecified: Secondary | ICD-10-CM | POA: Diagnosis not present

## 2021-10-19 DIAGNOSIS — E559 Vitamin D deficiency, unspecified: Secondary | ICD-10-CM

## 2021-10-19 MED ORDER — METOPROLOL SUCCINATE ER 100 MG PO TB24: 100.0000 mg | ORAL_TABLET | Freq: Every day | ORAL | 3 refills | Status: DC

## 2021-10-19 MED ORDER — HYDROCODONE BIT-HOMATROP MBR 5-1.5 MG/5ML PO SOLN: 5.0000 mL | Freq: Four times a day (QID) | ORAL | 0 refills | Status: AC | PRN

## 2021-10-19 MED ORDER — AZITHROMYCIN 250 MG PO TABS: ORAL_TABLET | ORAL | 1 refills | Status: AC

## 2021-10-19 NOTE — Patient Instructions (Signed)
Please take all new medication as prescribed - the antibiotic, cough medicine  Ok to increase the toprol xl to 100 mg per day  Please continue all other medications as before, and refills have been done if requested.  Please have the pharmacy call with any other refills you may need.  Please continue your efforts at being more active, low cholesterol diet, and weight control  Please keep your appointments with your specialists as you may have planned  Please have your lab testing done next week as you mentioned  You will be contacted by phone if any changes need to be made immediately.  Otherwise, you will receive a letter about your results with an explanation, but please check with MyChart first.  Please remember to sign up for MyChart if you have not done so, as this will be important to you in the future with finding out test results, communicating by private email, and scheduling acute appointments online when needed.  Please make an Appointment to return in 6 months, or sooner if needed

## 2021-10-19 NOTE — Progress Notes (Signed)
Patient ID: Jennifer Andrade, female   DOB: [birth year], 70 y.o.   MRN: [id number]        Chief Complaint: follow up HTN, URi symptoms       HPI:  Jennifer Andrade is a 70 y.o. female  Here with 2-3 days acute onset fever, facial pain, pressure, headache, general weakness and malaise, and greenish d/c, with mild ST and cough, but pt denies chest pain, wheezing, increased sob or doe, orthopnea, PND, increased LE swelling, palpitations, dizziness or syncope.  BP at home has been persistently mild elevated > 140./90.  Unfortuantely, mother died last wk, and grieving today, but Denies worsening depressive symptoms, suicidal ideation, or panic.   Pt denies fever, wt loss, night sweats, loss of appetite, or other constitutional symptoms  Denies hyper or hypo thyroid symptoms such as voice, skin or hair change. Wt Readings from Last 3 Encounters:  10/19/21 111 lb 9.6 oz (50.6 kg)  09/13/21 111 lb 6.4 oz (50.5 kg)  06/29/21 112 lb 9.6 oz (51.1 kg)   BP Readings from Last 3 Encounters:  10/19/21 (!) 148/80  09/13/21 (!) 178/90  06/29/21 (!) 152/88         Past Medical History:  Diagnosis Date   ANEMIA-IRON DEFICIENCY 04/15/2008   Cataract    CKD (chronic kidney disease) stage 3, GFR 30-59 ml/min ([facility]) 03/21/2019   Heart murmur    as a child   HYPERLIPIDEMIA 04/15/2008   HYPERTENSION 04/15/2008   HYPOTHYROIDISM 04/15/2008   LUMBAR RADICULOPATHY, LEFT 11/24/2009   Migraine 03/25/2011   OSTEOPENIA 04/15/2008   Other specified forms of hearing loss 09/30/2010   Phlebitis 1974   when pregnant   SINUSITIS- ACUTE-NOS 09/30/2010   Vitamin D deficiency 10/2011   level 16   Past Surgical History:  Procedure Laterality Date   BACK SURGERY     cataract surg     lens replacement Bil   tooth removal  2002   TUBAL LIGATION      reports that she quit smoking about 5 years ago. Her smoking use included cigarettes. She has a 47.00 pack-year smoking history. She has never used smokeless tobacco. She reports current  alcohol use of about 1.0 standard drink per week. She reports that she does not use drugs. family history includes Colon cancer in her mother; Heart disease in her father; Hypertension in her father; Kidney disease in her father; Thyroid disease in her mother. Allergies  Allergen Reactions   Atorvastatin     REACTION: myalgias   Rosuvastatin     REACTION: memory problem   Sulfa Antibiotics Other (See Comments)    Pt fearful to take after daughter with Katherina Right   Current Outpatient Medications on File Prior to Visit  Medication Sig Dispense Refill   amLODipine (NORVASC) 5 MG tablet Take 1 tablet (5 mg total) by mouth daily. 90 tablet 3   Cholecalciferol 50 MCG (2000 UT) TABS 1 ta by mouth once daily 30 tablet 99   hydrocortisone (ANUSOL-HC) 2.5 % rectal cream PLACE 1 APPLICATION RECTALLY 2 (TWO) TIMES DAILY. 30 g 0   levothyroxine (SYNTHROID) 50 MCG tablet Take 1 tablet (50 mcg total) by mouth daily. 90 tablet 3   olmesartan (BENICAR) 40 MG tablet Take 1 tablet (40 mg total) by mouth daily. 90 tablet 3   PRALUENT 150 MG/ML SOAJ INJECT 1 DOSE INTO THE SKIN EVERY 14 (FOURTEEN) DAYS. 2 mL 11   ezetimibe (ZETIA) 10 MG tablet Take 1 tablet (10 mg total)  by mouth daily. 90 tablet 3   No current facility-administered medications on file prior to visit.        ROS:  All others reviewed and negative.  Objective        PE:  BP (!) 148/80 (BP Location: Right Arm, Patient Position: Sitting, Cuff Size: Normal)    Pulse 70    Temp 97.8 F (36.6 C) (Oral)    Ht 5\' 6"  (1.676 m)    Wt 111 lb 9.6 oz (50.6 kg)    LMP 10/24/2001    SpO2 96%    BMI 18.01 kg/m                 Constitutional: Pt appears in NAD               HENT: Head: NCAT.                Right Ear: External ear normal.                 Left Ear: External ear normal. Bilat tm's with mild erythema.  Max sinus areas mild tender.  Pharynx with mild erythema, no exudate                Eyes: . Pupils are equal, round, and reactive to  light. Conjunctivae and EOM are normal               Nose: without d/c or deformity               Neck: Neck supple. Gross normal ROM               Cardiovascular: Normal rate and regular rhythm.                 Pulmonary/Chest: Effort normal and breath sounds without rales or wheezing.                Abd:  Soft, NT, ND, + BS, no organomegaly               Neurological: Pt is alert. At baseline orientation, motor grossly intact               Skin: Skin is warm. No rashes, no other new lesions, LE edema - none               Psychiatric: Pt behavior is normal without agitation ,, mild sad  Micro: none  Cardiac tracings I have personally interpreted today:  none  Pertinent Radiological findings (summarize): none   Lab Results  Component Value Date   WBC 7.0 06/17/2021   HGB 13.2 06/17/2021   HCT 40.9 06/17/2021   PLT 207 06/17/2021   GLUCOSE 88 06/17/2021   CHOL 151 06/17/2021   TRIG 114 06/17/2021   HDL 61 06/17/2021   LDLDIRECT 195.2 03/22/2011   LDLCALC 70 06/17/2021   ALT 11 06/17/2021   AST 18 06/17/2021   NA 142 06/17/2021   K 5.2 06/17/2021   CL 103 06/17/2021   CREATININE 1.02 (H) 06/17/2021   BUN 33 (H) 06/17/2021   CO2 24 06/17/2021   TSH 0.024 (L) 06/17/2021   Assessment/Plan:  Jennifer Andrade is a 70 y.o. White or Caucasian [1] female with  has a past medical history of ANEMIA-IRON DEFICIENCY (04/15/2008), Cataract, CKD (chronic kidney disease) stage 3, GFR 30-59 ml/min (HCC) (03/21/2019), Heart murmur, HYPERLIPIDEMIA (04/15/2008), HYPERTENSION (04/15/2008), HYPOTHYROIDISM (04/15/2008), LUMBAR RADICULOPATHY, LEFT (11/24/2009), Migraine (03/25/2011), OSTEOPENIA (04/15/2008), Other specified forms of  hearing loss (09/30/2010), Phlebitis (1974), SINUSITIS- ACUTE-NOS (09/30/2010), and Vitamin D deficiency (10/2011).  Vitamin D deficiency Last vitamin D Lab Results  Component Value Date   VD25OH 14.89 (L) 09/09/2020   Low, reminded to start oral  replacement   Hypothyroidism Lab Results  Component Value Date   TSH 0.024 (L) 06/17/2021   Slight low tsh, pt to continue levothyroxine as is, declines change in tx   Hyperlipidemia Lab Results  Component Value Date   LDLCALC 70 06/17/2021   Mild uncontrolled, goal ldl < 70, pt to continue current praluent as declines change   Essential hypertension BP Readings from Last 3 Encounters:  10/19/21 (!) 148/80  09/13/21 (!) 178/90  06/29/21 (!) 152/88   Mild uncontrolled, pt to increase the toprol xl 100 qd   URI (upper respiratory infection) Mild to mod, for antibx course,  to f/u any worsening symptoms or concerns  Followup: No follow-ups on file.  Cathlean Cower, MD 10/23/2021 7:22 PM [facility]

## 2021-10-19 NOTE — Assessment & Plan Note (Signed)
Last vitamin D Lab Results  Component Value Date   VD25OH 14.89 (L) 09/09/2020   Low, reminded to start oral replacement

## 2021-10-22 ENCOUNTER — Ambulatory Visit: Payer: Medicare HMO

## 2021-10-22 ENCOUNTER — Other Ambulatory Visit: Payer: Self-pay

## 2021-10-22 ENCOUNTER — Telehealth: Payer: Self-pay

## 2021-10-22 NOTE — Telephone Encounter (Signed)
Called patient x 3 no no answer. No vm .  Patient may reschedule for next available appointment .  L.Lahna Nath,LPN

## 2021-10-23 DIAGNOSIS — J069 Acute upper respiratory infection, unspecified: Secondary | ICD-10-CM | POA: Insufficient documentation

## 2021-10-23 NOTE — Assessment & Plan Note (Signed)
Mild to mod, for antibx course,  to f/u any worsening symptoms or concerns 

## 2021-10-23 NOTE — Assessment & Plan Note (Signed)
BP Readings from Last 3 Encounters:  10/19/21 (!) 148/80  09/13/21 (!) 178/90  06/29/21 (!) 152/88   Mild uncontrolled, pt to increase the toprol xl 100 qd

## 2021-10-23 NOTE — Assessment & Plan Note (Signed)
Lab Results  Component Value Date   TSH 0.024 (L) 06/17/2021   Slight low tsh, pt to continue levothyroxine as is, declines change in tx

## 2021-10-23 NOTE — Assessment & Plan Note (Signed)
Lab Results  Component Value Date   LDLCALC 70 06/17/2021   Mild uncontrolled, goal ldl < 70, pt to continue current praluent as declines change

## 2021-10-31 ENCOUNTER — Other Ambulatory Visit: Payer: Self-pay | Admitting: Internal Medicine

## 2021-11-09 ENCOUNTER — Other Ambulatory Visit: Payer: Self-pay | Admitting: Internal Medicine

## 2021-11-09 NOTE — Telephone Encounter (Signed)
Please refill as per office routine med refill policy (all routine meds to be refilled for 3 mo or monthly (per pt preference) up to one year from last visit, then month to month grace period for 3 mo, then further med refills will have to be denied) ? ?

## 2021-11-11 ENCOUNTER — Ambulatory Visit
Admission: RE | Admit: 2021-11-11 | Discharge: 2021-11-11 | Disposition: A | Discharge status: Home / Self Care | Payer: Medicare HMO | Source: Ambulatory Visit | Attending: Internal Medicine | Admitting: Internal Medicine

## 2021-11-11 ENCOUNTER — Other Ambulatory Visit: Payer: Self-pay

## 2021-11-11 DIAGNOSIS — Z1231 Encounter for screening mammogram for malignant neoplasm of breast: Secondary | ICD-10-CM | POA: Diagnosis not present

## 2021-11-12 ENCOUNTER — Other Ambulatory Visit: Payer: Self-pay | Admitting: Internal Medicine

## 2021-11-19 ENCOUNTER — Other Ambulatory Visit: Payer: Self-pay | Admitting: Internal Medicine

## 2022-05-19 ENCOUNTER — Telehealth: Payer: Self-pay | Admitting: Internal Medicine

## 2022-05-19 NOTE — Telephone Encounter (Signed)
Left message for patient to call back to schedule Medicare Annual Wellness Visit   Last AWV  09/28/20  Please schedule at anytime with [facility]-Nurse Health Advisor if patient calls the office back.      Any questions, please call me at [phone] 

## 2022-05-20 DIAGNOSIS — L570 Actinic keratosis: Secondary | ICD-10-CM | POA: Diagnosis not present

## 2022-06-13 ENCOUNTER — Ambulatory Visit (INDEPENDENT_AMBULATORY_CARE_PROVIDER_SITE_OTHER): Payer: Medicare HMO | Admitting: Internal Medicine

## 2022-06-13 VITALS — BP 138/84 | HR 55 | Temp 97.8°F | Ht 66.0 in | Wt 111.0 lb

## 2022-06-13 DIAGNOSIS — D509 Iron deficiency anemia, unspecified: Secondary | ICD-10-CM | POA: Diagnosis not present

## 2022-06-13 DIAGNOSIS — E039 Hypothyroidism, unspecified: Secondary | ICD-10-CM | POA: Diagnosis not present

## 2022-06-13 DIAGNOSIS — N1831 Chronic kidney disease, stage 3a: Secondary | ICD-10-CM

## 2022-06-13 DIAGNOSIS — E538 Deficiency of other specified B group vitamins: Secondary | ICD-10-CM | POA: Diagnosis not present

## 2022-06-13 DIAGNOSIS — E7800 Pure hypercholesterolemia, unspecified: Secondary | ICD-10-CM | POA: Diagnosis not present

## 2022-06-13 DIAGNOSIS — Z0000 Encounter for general adult medical examination without abnormal findings: Secondary | ICD-10-CM | POA: Diagnosis not present

## 2022-06-13 DIAGNOSIS — E559 Vitamin D deficiency, unspecified: Secondary | ICD-10-CM | POA: Diagnosis not present

## 2022-06-13 DIAGNOSIS — I10 Essential (primary) hypertension: Secondary | ICD-10-CM

## 2022-06-13 DIAGNOSIS — Z0001 Encounter for general adult medical examination with abnormal findings: Secondary | ICD-10-CM

## 2022-06-13 NOTE — Assessment & Plan Note (Signed)
Lab Results  Component Value Date   [id number] 09/09/2020   Stable, cont oral replacement - b12 1000 mcg qd

## 2022-06-13 NOTE — Assessment & Plan Note (Signed)
Last vitamin D Lab Results  Component Value Date   VD25OH 14.89 (L) 09/09/2020   Low to start oral replacement

## 2022-06-13 NOTE — Progress Notes (Unsigned)
Patient ID: Jennifer Andrade, female   DOB: [birth year], 71 y.o.   MRN: [id number]         Chief Complaint:: wellness exam and low vit d, low thyroid, hld, htn, ckd, low b12, low iron       HPI:  Jennifer Andrade is a 71 y.o. female here for wellness exam; has cologuard kit at home to be done soon; declines all vax, dxa, colonoscopy ow up to date                        Also has f/u carotid study soon.  Prefers lab to be done per card staff as less painful so no labs to be drawn today.  Pt denies chest pain, increased sob or doe, wheezing, orthopnea, PND, increased LE swelling, palpitations, dizziness or syncope.   Pt denies polydipsia, polyuria, or new focal neuro s/s.       Wt Readings from Last 3 Encounters:  06/13/22 111 lb (50.3 kg)  10/19/21 111 lb 9.6 oz (50.6 kg)  09/13/21 111 lb 6.4 oz (50.5 kg)   BP Readings from Last 3 Encounters:  06/13/22 138/84  10/19/21 (!) 148/80  09/13/21 (!) 178/90   Immunization History  Administered Date(s) Administered   PFIZER(Purple Top)SARS-COV-2 Vaccination 01/06/2020, 01/27/2020   There are no preventive care reminders to display for this patient.     Past Medical History:  Diagnosis Date   ANEMIA-IRON DEFICIENCY 04/15/2008   Cataract    CKD (chronic kidney disease) stage 3, GFR 30-59 ml/min (HCC) 03/21/2019   Heart murmur    as a child   HYPERLIPIDEMIA 04/15/2008   HYPERTENSION 04/15/2008   HYPOTHYROIDISM 04/15/2008   LUMBAR RADICULOPATHY, LEFT 11/24/2009   Migraine 03/25/2011   OSTEOPENIA 04/15/2008   Other specified forms of hearing loss 09/30/2010   Phlebitis 1974   when pregnant   SINUSITIS- ACUTE-NOS 09/30/2010   Vitamin D deficiency 10/2011   level 16   Past Surgical History:  Procedure Laterality Date   BACK SURGERY     cataract surg     lens replacement Bil   tooth removal  2002   TUBAL LIGATION      reports that she quit smoking about 5 years ago. Her smoking use included cigarettes. She has a 47.00 pack-year smoking history.  She has never used smokeless tobacco. She reports current alcohol use of about 1.0 standard drink of alcohol per week. She reports that she does not use drugs. family history includes Colon cancer in her mother; Heart disease in her father; Hypertension in her father; Kidney disease in her father; Thyroid disease in her mother. Allergies  Allergen Reactions   Atorvastatin     REACTION: myalgias   Rosuvastatin     REACTION: memory problem   Shellfish-Derived Products Other (See Comments)   Sulfa Antibiotics Other (See Comments)    Pt fearful to take after daughter with Katherina Right   Current Outpatient Medications on File Prior to Visit  Medication Sig Dispense Refill   amLODipine (NORVASC) 5 MG tablet Take 1 tablet (5 mg total) by mouth daily. 90 tablet 3   Cholecalciferol 50 MCG (2000 UT) TABS 1 ta by mouth once daily 30 tablet 99   ezetimibe (ZETIA) 10 MG tablet TAKE 1 TABLET BY MOUTH EVERY DAY 90 tablet 3   hydrocortisone (ANUSOL-HC) 2.5 % rectal cream PLACE 1 APPLICATION RECTALLY 2 (TWO) TIMES DAILY. 30 g 0   levothyroxine (SYNTHROID) 50 MCG tablet Take  1 tablet (50 mcg total) by mouth daily. 90 tablet 3   metoprolol succinate (TOPROL-XL) 100 MG 24 hr tablet Take 1 tablet (100 mg total) by mouth daily. Take with or immediately following a meal. 90 tablet 3   olmesartan (BENICAR) 40 MG tablet Take 1 tablet (40 mg total) by mouth daily. 90 tablet 3   PRALUENT 150 MG/ML SOAJ INJECT 1 DOSE INTO THE SKIN EVERY 14 (FOURTEEN) DAYS. 6 mL 3   No current facility-administered medications on file prior to visit.        ROS:  All others reviewed and negative.  Objective        PE:  BP 138/84 (BP Location: Right Arm, Patient Position: Sitting, Cuff Size: Normal)   Pulse (!) 55   Temp 97.8 F (36.6 C) (Oral)   Ht $R'5\' 6"'ao$  (1.676 m)   Wt 111 lb (50.3 kg)   LMP 10/24/2001   SpO2 97%   BMI 17.92 kg/m                 Constitutional: Pt appears in NAD               HENT: Head: NCAT.                 Right Ear: External ear normal.                 Left Ear: External ear normal.                Eyes: . Pupils are equal, round, and reactive to light. Conjunctivae and EOM are normal               Nose: without d/c or deformity               Neck: Neck supple. Gross normal ROM               Cardiovascular: Normal rate and regular rhythm.                 Pulmonary/Chest: Effort normal and breath sounds without rales or wheezing.                Abd:  Soft, NT, ND, + BS, no organomegaly               Neurological: Pt is alert. At baseline orientation, motor grossly intact               Skin: Skin is warm. No rashes, no other new lesions, LE edema -  none               Psychiatric: Pt behavior is normal without agitation   Micro: none  Cardiac tracings I have personally interpreted today:  none  Pertinent Radiological findings (summarize): none   Lab Results  Component Value Date   WBC 7.0 06/17/2021   HGB 13.2 06/17/2021   HCT 40.9 06/17/2021   PLT 207 06/17/2021   GLUCOSE 88 06/17/2021   CHOL 151 06/17/2021   TRIG 114 06/17/2021   HDL 61 06/17/2021   LDLDIRECT 195.2 03/22/2011   LDLCALC 70 06/17/2021   ALT 11 06/17/2021   AST 18 06/17/2021   NA 142 06/17/2021   K 5.2 06/17/2021   CL 103 06/17/2021   CREATININE 1.02 (H) 06/17/2021   BUN 33 (H) 06/17/2021   CO2 24 06/17/2021   TSH 0.024 (L) 06/17/2021   Assessment/Plan:  Jennifer Andrade is a 71 y.o. White or Caucasian [1]  female with  has a past medical history of ANEMIA-IRON DEFICIENCY (04/15/2008), Cataract, CKD (chronic kidney disease) stage 3, GFR 30-59 ml/min (HCC) (03/21/2019), Heart murmur, HYPERLIPIDEMIA (04/15/2008), HYPERTENSION (04/15/2008), HYPOTHYROIDISM (04/15/2008), LUMBAR RADICULOPATHY, LEFT (11/24/2009), Migraine (03/25/2011), OSTEOPENIA (04/15/2008), Other specified forms of hearing loss (09/30/2010), Phlebitis (1974), SINUSITIS- ACUTE-NOS (09/30/2010), and Vitamin D deficiency (10/2011).  Vitamin D deficiency Last  vitamin D Lab Results  Component Value Date   VD25OH 14.89 (L) 09/09/2020   Low to start oral replacement   B12 deficiency Lab Results  Component Value Date   [id number] 078 09/09/2020   Stable, cont oral replacement - b12 1000 mcg qd   Encounter for well adult exam with abnormal findings Age and sex appropriate education and counseling updated with regular exercise and diet Referrals for preventative services - declines dxa, colonoscopy but ok for cologuard Immunizations addressed - declines all vax Smoking counseling  - none needed Evidence for depression or other mood disorder - none significant Most recent labs reviewed. I have personally reviewed and have noted: 1) the patient's medical and social history 2) The patient's current medications and supplements 3) The patient's height, weight, and BMI have been recorded in the chart   ANEMIA-IRON DEFICIENCY No recent overt bleeding, for iron lab with next labs  CKD (chronic kidney disease) stage 3, GFR 30-59 ml/min (HCC) Lab Results  Component Value Date   CREATININE 1.02 (H) 06/17/2021   Stable overall, cont to avoid nephrotoxins, declines f/u lab today  Essential hypertension BP Readings from Last 3 Encounters:  06/13/22 138/84  10/19/21 (!) 148/80  09/13/21 (!) 178/90   Stable, pt to continue medical treatment norvasc 5 mg qd, toprol xl 100 mb qd, benicar 40 mg qd   Hyperlipidemia Lab Results  Component Value Date   LDLCALC 70 06/17/2021   Stable, pt to continue current praluent   Hypothyroidism Lab Results  Component Value Date   TSH 0.024 (L) 06/17/2021   Stable, pt to continue levothyroxine 50 mg qd, will need f/u alb  Followup: Return in about 6 months (around 12/14/2022).  Cathlean Cower, MD 06/16/2022 8:31 PM [facility]

## 2022-06-16 ENCOUNTER — Encounter: Payer: Self-pay | Admitting: Internal Medicine

## 2022-06-16 NOTE — Assessment & Plan Note (Signed)
Lab Results  Component Value Date   LDLCALC 70 06/17/2021   Stable, pt to continue current praluent

## 2022-06-16 NOTE — Assessment & Plan Note (Signed)
Lab Results  Component Value Date   TSH 0.024 (L) 06/17/2021   Stable, pt to continue levothyroxine 50 mg qd, will need f/u alb

## 2022-06-16 NOTE — Assessment & Plan Note (Signed)
Lab Results  Component Value Date   CREATININE 1.02 (H) 06/17/2021   Stable overall, cont to avoid nephrotoxins, declines f/u lab today

## 2022-06-16 NOTE — Assessment & Plan Note (Signed)
Age and sex appropriate education and counseling updated with regular exercise and diet Referrals for preventative services - declines dxa, colonoscopy but ok for cologuard Immunizations addressed - declines all vax Smoking counseling  - none needed Evidence for depression or other mood disorder - none significant Most recent labs reviewed. I have personally reviewed and have noted: 1) the patient's medical and social history 2) The patient's current medications and supplements 3) The patient's height, weight, and BMI have been recorded in the chart

## 2022-06-16 NOTE — Assessment & Plan Note (Signed)
BP Readings from Last 3 Encounters:  06/13/22 138/84  10/19/21 (!) 148/80  09/13/21 (!) 178/90   Stable, pt to continue medical treatment norvasc 5 mg qd, toprol xl 100 mb qd, benicar 40 mg qd

## 2022-06-16 NOTE — Assessment & Plan Note (Signed)
No recent overt bleeding, for iron lab with next labs

## 2022-06-16 NOTE — Patient Instructions (Signed)
Please take OTC Vitamin D3 at 2000 units per day, indefinitely  Please continue all other medications as before, and refills have been done if requested.  Please have the pharmacy call with any other refills you may need.  Please continue your efforts at being more active, low cholesterol diet, and weight control.  You are otherwise up to date with prevention measures today.  Please keep your appointments with your specialists as you may have planned  Please make an Appointment to return in 6 months, or sooner if needed

## 2022-06-22 DIAGNOSIS — L821 Other seborrheic keratosis: Secondary | ICD-10-CM | POA: Diagnosis not present

## 2022-06-22 DIAGNOSIS — D225 Melanocytic nevi of trunk: Secondary | ICD-10-CM | POA: Diagnosis not present

## 2022-06-22 DIAGNOSIS — L570 Actinic keratosis: Secondary | ICD-10-CM | POA: Diagnosis not present

## 2022-06-22 DIAGNOSIS — L578 Other skin changes due to chronic exposure to nonionizing radiation: Secondary | ICD-10-CM | POA: Diagnosis not present

## 2022-07-08 ENCOUNTER — Ambulatory Visit (HOSPITAL_COMMUNITY)
Admission: RE | Admit: 2022-07-08 | Discharge: 2022-07-08 | Disposition: A | Discharge status: Home / Self Care | Payer: Medicare HMO | Source: Ambulatory Visit | Attending: Internal Medicine | Admitting: Internal Medicine

## 2022-07-08 DIAGNOSIS — G458 Other transient cerebral ischemic attacks and related syndromes: Secondary | ICD-10-CM

## 2022-07-08 DIAGNOSIS — I6523 Occlusion and stenosis of bilateral carotid arteries: Secondary | ICD-10-CM

## 2022-07-11 ENCOUNTER — Other Ambulatory Visit: Payer: Self-pay

## 2022-07-11 DIAGNOSIS — I6523 Occlusion and stenosis of bilateral carotid arteries: Secondary | ICD-10-CM

## 2022-07-11 DIAGNOSIS — I779 Disorder of arteries and arterioles, unspecified: Secondary | ICD-10-CM

## 2022-07-11 DIAGNOSIS — G458 Other transient cerebral ischemic attacks and related syndromes: Secondary | ICD-10-CM

## 2022-07-12 ENCOUNTER — Other Ambulatory Visit: Payer: Self-pay | Admitting: Internal Medicine

## 2022-07-12 DIAGNOSIS — E7849 Other hyperlipidemia: Secondary | ICD-10-CM

## 2022-07-13 ENCOUNTER — Other Ambulatory Visit: Payer: Self-pay

## 2022-07-13 DIAGNOSIS — I6523 Occlusion and stenosis of bilateral carotid arteries: Secondary | ICD-10-CM

## 2022-07-19 ENCOUNTER — Encounter: Payer: Self-pay | Admitting: Vascular Surgery

## 2022-07-19 ENCOUNTER — Ambulatory Visit: Payer: Medicare HMO | Admitting: Vascular Surgery

## 2022-07-19 VITALS — BP 160/73 | HR 55 | Temp 98.5°F | Resp 20 | Ht 66.0 in | Wt 111.0 lb

## 2022-07-19 DIAGNOSIS — I6523 Occlusion and stenosis of bilateral carotid arteries: Secondary | ICD-10-CM

## 2022-07-19 DIAGNOSIS — I771 Stricture of artery: Secondary | ICD-10-CM

## 2022-07-19 NOTE — Progress Notes (Signed)
[facility]  ASSESSMENT / PLAN: Jennifer Andrade is a 71 y.o. female with asymptomatic bilateral 1-39% carotid artery stenosis. She has asymptomatic left subclavian artery stenosis.  The patient should continue best medical therapy for carotid artery stenosis including: Complete cessation from all tobacco products. Blood glucose control with goal A1c < 7%. Blood pressure control with goal blood pressure < 140/90 mmHg. Lipid reduction therapy with goal LDL-C <100 mg/dL  Aspirin '81mg'$  PO QD.  Praulent injections.  Counseled about the benign nature of these findings.  She would like to follow-up with me yearly for carotid duplex for reassurance.  CHIEF COMPLAINT: Carotid gnosis, left subclavian artery stenosis on recent duplex  HISTORY OF PRESENT ILLNESS: Jennifer Andrade is a 71 y.o. female to clinic for evaluation of carotid artery stenosis and left subclavian artery stenosis.  The patient is asymptomatic from a carotid artery and left subclavian artery perspective.  She specifically denies any focal symptoms.  She denies any left upper extremity symptoms such as claudication or rest pain.  She has no wounds about her left upper extremity.  The bulk of our visit was spent reviewing the duplex findings and their meaning for her global health.  Past Medical History:  Diagnosis Date   ANEMIA-IRON DEFICIENCY 04/15/2008   Cataract    CKD (chronic kidney disease) stage 3, GFR 30-59 ml/min (HCC) 03/21/2019   Heart murmur    as a child   HYPERLIPIDEMIA 04/15/2008   HYPERTENSION 04/15/2008   HYPOTHYROIDISM 04/15/2008   LUMBAR RADICULOPATHY, LEFT 11/24/2009   Migraine 03/25/2011   OSTEOPENIA 04/15/2008   Other specified forms of hearing loss 09/30/2010   Phlebitis January 19, 1973   when pregnant   SINUSITIS- ACUTE-NOS 09/30/2010   Vitamin D deficiency 10/2011   level 16    Past Surgical History:  Procedure Laterality Date   BACK SURGERY     cataract surg     lens replacement  Bil   tooth removal  2001/01/19   TUBAL LIGATION      Family History  Problem Relation Age of Onset   Heart disease Father    Kidney disease Father    Hypertension Father    Thyroid disease Mother    Colon cancer Mother    Esophageal cancer Neg Hx    Rectal cancer Neg Hx    Stomach cancer Neg Hx    Inflammatory bowel disease Neg Hx    Liver disease Neg Hx    Pancreatic cancer Neg Hx     Social History   Socioeconomic History   Marital status: Married    Spouse name: Not on file   Number of children: 3   Years of education: Not on file   Highest education level: Not on file  Occupational History   Occupation: homemaker, 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, 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    Employer: UNEMPLOYED  Tobacco Use   Smoking status: Former    Packs/day: 1.00    Years: 47.00    Total pack years: 47.00    Types: Cigarettes    Quit date: 09/28/2016    Years since quitting: 5.8   Smokeless tobacco: Never   Tobacco comments:    had quit for 5 years during that time  Vaping Use   Vaping Use: Never used  Substance and Sexual Activity   Alcohol use: Yes    Alcohol/week: 1.0 standard drink of alcohol    Types: 1 Glasses of wine per week  Comment: rare, less than weekly   Drug use: No   Sexual activity: Not Currently    Birth control/protection: Surgical    Comment: 1st intercourse 71 yo--Fewer than 5 partners  Other Topics Concern   Not on file  Social History Narrative   Not on file   Social Determinants of Health   Financial Resource Strain: Low Risk  (09/28/2020)   Overall Financial Resource Strain (CARDIA)    Difficulty of Paying Living Expenses: Not hard at all  Food Insecurity: No Food Insecurity (09/28/2020)   Hunger Vital Sign    Worried About Running Out of Food in the Last Year: Never true    Ran Out of Food in the Last Year: Never true  Transportation Needs: No Transportation Needs (09/28/2020)   PRAPARE - Hydrologist (Medical): No     Lack of Transportation (Non-Medical): No  Physical Activity: Sufficiently Active (09/28/2020)   Exercise Vital Sign    Days of Exercise per Week: 5 days    Minutes of Exercise per Session: 30 min  Stress: No Stress Concern Present (09/28/2020)   [place]    Feeling of Stress : Not at all  Social Connections: [place] (09/28/2020)   Social Connection and Isolation Panel [NHANES]    Frequency of Communication with Friends and Family: More than three times a week    Frequency of Social Gatherings with Friends and Family: More than three times a week    Attends Religious Services: More than 4 times per year    Active Member of [employer] or Organizations: Yes    Attends Music therapist: More than 4 times per year    Marital Status: Married  Human resources officer Violence: Not on file    Allergies  Allergen Reactions   Atorvastatin     REACTION: myalgias   Rosuvastatin     REACTION: memory problem   Shellfish-Derived Products Other (See Comments)   Sulfa Antibiotics Other (See Comments)    Pt fearful to take after daughter with Katherina Right    Current Outpatient Medications  Medication Sig Dispense Refill   amLODipine (NORVASC) 5 MG tablet Take 1 tablet (5 mg total) by mouth daily. 90 tablet 3   Cholecalciferol 50 MCG (2000 UT) TABS 1 ta by mouth once daily 30 tablet 99   ezetimibe (ZETIA) 10 MG tablet TAKE 1 TABLET BY MOUTH EVERY DAY 90 tablet 3   hydrocortisone (ANUSOL-HC) 2.5 % rectal cream PLACE 1 APPLICATION RECTALLY 2 (TWO) TIMES DAILY. 30 g 0   levothyroxine (SYNTHROID) 50 MCG tablet Take 1 tablet (50 mcg total) by mouth daily. 90 tablet 3   metoprolol succinate (TOPROL-XL) 100 MG 24 hr tablet Take 1 tablet (100 mg total) by mouth daily. Take with or immediately following a meal. 90 tablet 3   olmesartan (BENICAR) 40 MG tablet Take 1 tablet (40 mg total) by mouth daily. 90 tablet 3    PRALUENT 150 MG/ML SOAJ INJECT 1 DOSE INTO THE SKIN EVERY 14 (FOURTEEN) DAYS. 6 mL 3   No current facility-administered medications for this visit.    PHYSICAL EXAM Vitals:   07/19/22 1509 07/19/22 1511  BP: (!) 145/70 (!) 160/73  Pulse: (!) 55   Resp: 20   Temp: 98.5 F (36.9 C)   SpO2: 92%   Weight: 111 lb (50.3 kg)   Height: '5\' 6"'$  (1.676 m)    Elderly woman in no acute distress  Regular rate and rhythm Unlabored breathing Palpable radial pulses bilaterally right greater than left  PERTINENT LABORATORY AND RADIOLOGIC DATA  Most recent CBC    Latest Ref Rng & Units 06/17/2021   10:20 AM 03/19/2019   10:01 AM 02/14/2017    4:16 PM  CBC  WBC 3.4 - 10.8 x10E3/uL 7.0  5.6  8.8    8.7   Hemoglobin 11.1 - 15.9 g/dL 13.2  13.7  15.0    15.0   Hematocrit 34.0 - 46.6 % 40.9  40.3  45.2    44.9   Platelets 150 - 450 x10E3/uL 207  180.0  205.0    210.0      Most recent CMP    Latest Ref Rng & Units 06/17/2021   10:20 AM 03/19/2019   10:01 AM 02/14/2017    4:16 PM  CMP  Glucose 65 - 99 mg/dL 88  102  95   BUN 8 - 27 mg/dL 33  21  23   Creatinine 0.57 - 1.00 mg/dL 1.02  1.15  1.02   Sodium 134 - 144 mmol/L 142  142  137   Potassium 3.5 - 5.2 mmol/L 5.2  4.3  4.8   Chloride 96 - 106 mmol/L 103  104  101   CO2 20 - 29 mmol/L '24  29  28   '$ Calcium 8.7 - 10.3 mg/dL 10.6  9.8  10.5   Total Protein 6.0 - 8.5 g/dL 7.5  7.4  8.4   Total Bilirubin 0.0 - 1.2 mg/dL 0.4  0.5  0.5   Alkaline Phos 44 - 121 IU/L 49  46  52   AST 0 - 40 IU/L '18  13  15   '$ ALT 0 - 32 IU/L '11  9  9     '$ Renal function CrCl cannot be calculated (Patient's most recent lab result is older than the maximum 21 days allowed.).  No results found for: "HGBA1C"  LDL Chol Calc (NIH)  Date Value Ref Range Status  06/17/2021 70 0 - 99 mg/dL Final   Direct LDL  Date Value Ref Range Status  03/22/2011 195.2 mg/dL Final    Comment:    Optimal:  <100 mg/dLNear or Above Optimal:  100-129 mg/dLBorderline High:   130-159 mg/dLHigh:  160-189 mg/dLVery High:  >190 mg/dL    Carotid artery duplex 1 to 39% carotid artery stenosis bilaterally Retrograde flow in the left tibial artery suggesting subclavian artery stenosis  Yevonne Aline. Stanford Breed, MD [facility] Phone Number: [phone] 07/19/2022 4:51 PM  Total time spent on preparing this encounter including chart review, data review, collecting history, examining the patient, coordinating care for this new patient, 45 minutes.  Portions of this report may have been transcribed using voice recognition software.  Every effort has been made to ensure accuracy; however, inadvertent computerized transcription errors may still be present.

## 2022-08-16 DIAGNOSIS — E7849 Other hyperlipidemia: Secondary | ICD-10-CM | POA: Diagnosis not present

## 2022-08-17 LAB — LIPID PANEL
Chol/HDL Ratio: 2.5 ratio (ref 0.0–4.4)
Cholesterol, Total: 177 mg/dL (ref 100–199)
HDL: 71 mg/dL (ref >39)
LDL Chol Calc (NIH): 89 mg/dL (ref 0–99)
Triglycerides: 92 mg/dL (ref 0–149)
VLDL Cholesterol Cal: 17 mg/dL (ref 5–40)

## 2022-08-22 ENCOUNTER — Ambulatory Visit: Payer: Medicare HMO | Admitting: Internal Medicine

## 2022-08-27 ENCOUNTER — Other Ambulatory Visit: Payer: Self-pay | Admitting: Internal Medicine

## 2022-08-27 NOTE — Telephone Encounter (Signed)
Please refill as per office routine med refill policy (all routine meds to be refilled for 3 mo or monthly (per pt preference) up to one year from last visit, then month to month grace period for 3 mo, then further med refills will have to be denied) ? ?

## 2022-08-28 ENCOUNTER — Other Ambulatory Visit: Payer: Self-pay | Admitting: Internal Medicine

## 2022-08-28 NOTE — Telephone Encounter (Signed)
Please refill as per office routine med refill policy (all routine meds to be refilled for 3 mo or monthly (per pt preference) up to one year from last visit, then month to month grace period for 3 mo, then further med refills will have to be denied) ? ?

## 2022-09-06 ENCOUNTER — Other Ambulatory Visit: Payer: Self-pay | Admitting: Pharmacist

## 2022-09-06 ENCOUNTER — Telehealth: Payer: Self-pay | Admitting: Pharmacist

## 2022-09-06 DIAGNOSIS — D509 Iron deficiency anemia, unspecified: Secondary | ICD-10-CM

## 2022-09-06 DIAGNOSIS — E538 Deficiency of other specified B group vitamins: Secondary | ICD-10-CM

## 2022-09-06 DIAGNOSIS — E039 Hypothyroidism, unspecified: Secondary | ICD-10-CM

## 2022-09-06 DIAGNOSIS — E7800 Pure hypercholesterolemia, unspecified: Secondary | ICD-10-CM

## 2022-09-06 DIAGNOSIS — I10 Essential (primary) hypertension: Secondary | ICD-10-CM

## 2022-09-06 DIAGNOSIS — E559 Vitamin D deficiency, unspecified: Secondary | ICD-10-CM

## 2022-09-06 NOTE — Telephone Encounter (Signed)
Lab orders entered again under [employer]

## 2022-09-07 ENCOUNTER — Other Ambulatory Visit: Payer: Self-pay | Admitting: Internal Medicine

## 2022-09-22 ENCOUNTER — Encounter: Payer: Self-pay | Admitting: Internal Medicine

## 2022-10-04 ENCOUNTER — Other Ambulatory Visit: Payer: Self-pay | Admitting: Internal Medicine

## 2022-10-04 NOTE — Telephone Encounter (Signed)
Please refill as per office routine med refill policy (all routine meds to be refilled for 3 mo or monthly (per pt preference) up to one year from last visit, then month to month grace period for 3 mo, then further med refills will have to be denied) ? ?

## 2022-10-24 ENCOUNTER — Encounter (HOSPITAL_BASED_OUTPATIENT_CLINIC_OR_DEPARTMENT_OTHER): Payer: Self-pay

## 2022-10-24 ENCOUNTER — Emergency Department (HOSPITAL_BASED_OUTPATIENT_CLINIC_OR_DEPARTMENT_OTHER)
Admission: EM | Admit: 2022-10-24 | Discharge: 2022-10-24 | Disposition: A | Discharge status: Home / Self Care | Payer: Medicare HMO | Attending: Emergency Medicine | Admitting: Emergency Medicine

## 2022-10-24 ENCOUNTER — Other Ambulatory Visit: Payer: Self-pay

## 2022-10-24 DIAGNOSIS — N189 Chronic kidney disease, unspecified: Secondary | ICD-10-CM | POA: Insufficient documentation

## 2022-10-24 DIAGNOSIS — N3000 Acute cystitis without hematuria: Secondary | ICD-10-CM | POA: Diagnosis not present

## 2022-10-24 DIAGNOSIS — I129 Hypertensive chronic kidney disease with stage 1 through stage 4 chronic kidney disease, or unspecified chronic kidney disease: Secondary | ICD-10-CM | POA: Diagnosis not present

## 2022-10-24 DIAGNOSIS — R350 Frequency of micturition: Secondary | ICD-10-CM | POA: Diagnosis not present

## 2022-10-24 DIAGNOSIS — N3090 Cystitis, unspecified without hematuria: Secondary | ICD-10-CM | POA: Insufficient documentation

## 2022-10-24 LAB — URINALYSIS, ROUTINE W REFLEX MICROSCOPIC
Bacteria, UA: NONE SEEN
Bilirubin Urine: NEGATIVE
Glucose, UA: NEGATIVE mg/dL
Ketones, ur: NEGATIVE mg/dL
Nitrite: NEGATIVE
Protein, ur: 30 mg/dL — AB
RBC / HPF: >50 RBC/hpf — ABNORMAL HIGH (ref 0–5)
Specific Gravity, Urine: 1.019 (ref 1.005–1.030)
WBC, UA: >50 WBC/hpf — ABNORMAL HIGH (ref 0–5)
pH: 5.5 (ref 5.0–8.0)

## 2022-10-24 MED ORDER — CEFADROXIL 500 MG PO CAPS: 500.0000 mg | ORAL_CAPSULE | Freq: Two times a day (BID) | ORAL | 0 refills | Status: DC

## 2022-10-24 NOTE — ED Triage Notes (Signed)
Having urinary frequency for three to four days.  States feels like needs to go urinate but can only do a little at a time

## 2022-10-24 NOTE — ED Notes (Signed)
Discharge paperwork given and verbally understood. 

## 2022-10-24 NOTE — ED Provider Notes (Signed)
[facility] EMERGENC[facility] DEPT Provider Note   CSN: [id number] Arrival date & time: 10/24/22  0865     History  Chief Complaint  Patient presents with   Urinary Frequency    Jennifer Andrade is a 72 y.o. female with past medical history significant for hypertension, hyperlipidemia who presents with concern for urinary frequency, pressure for 3 to 4 days.  Sometimes she feels like she needs to urinate but can only go a little out of time.  She reports she is still making plenty of urine however, denies any feeling of incompletely emptying bladder.  She denies any nausea, vomiting, reports no significant abdominal pain, fever, chills.  She denies recent surgery, immunosuppression, vaginal discharge, vaginal bleeding.   Urinary Frequency       Home Medications Prior to Admission medications   Medication Sig Start Date End Date Taking? Authorizing Provider  cefadroxil (DURICEF) 500 MG capsule Take 1 capsule (500 mg total) by mouth 2 (two) times daily. 10/24/22  Yes Tawny Raspberry H, PA-C  amLODipine (NORVASC) 5 MG tablet TAKE 1 TABLET (5 MG TOTAL) BY MOUTH DAILY. 08/30/22   Biagio Borg, MD  Cholecalciferol 50 MCG (2000 UT) TABS 1 ta by mouth once daily 09/13/21   Biagio Borg, MD  ezetimibe (ZETIA) 10 MG tablet TAKE 1 TABLET BY MOUTH EVERY DAY 09/08/22   Hilty, Nadean Corwin, MD  hydrocortisone (ANUSOL-HC) 2.5 % rectal cream PLACE 1 APPLICATION RECTALLY 2 (TWO) TIMES DAILY. 11/10/20   Biagio Borg, MD  levothyroxine (SYNTHROID) 50 MCG tablet TAKE 1 TABLET BY MOUTH EVERY DAY 08/30/22   Biagio Borg, MD  metoprolol succinate (TOPROL-XL) 100 MG 24 hr tablet TAKE 1 TABLET BY MOUTH DAILY. TAKE WITH OR IMMEDIATELY FOLLOWING A MEAL. 10/05/22   Biagio Borg, MD  olmesartan (BENICAR) 40 MG tablet TAKE 1 TABLET BY MOUTH EVERY DAY 08/29/22   Biagio Borg, MD  PRALUENT 150 MG/ML SOAJ INJECT 1 DOSE INTO THE SKIN EVERY 14 (FOURTEEN) DAYS. 11/22/21   Hilty, Nadean Corwin, MD      Allergies     Atorvastatin, Rosuvastatin, Shellfish-derived products, and Sulfa antibiotics    Review of Systems   Review of Systems  Genitourinary:  Positive for frequency.  All other systems reviewed and are negative.   Physical Exam Updated Vital Signs BP (!) 160/64 (BP Location: Right Arm)   Pulse (!) 56   Temp 97.9 F (36.6 C) (Oral)   Resp 16   Ht '5\' 5"'$  (1.651 m)   Wt 51.3 kg   LMP 10/24/2001   SpO2 99%   BMI 18.80 kg/m  Physical Exam Vitals and nursing note reviewed.  Constitutional:      General: She is not in acute distress.    Appearance: Normal appearance.  HENT:     Head: Normocephalic and atraumatic.  Eyes:     General:        Right eye: No discharge.        Left eye: No discharge.  Cardiovascular:     Rate and Rhythm: Normal rate and regular rhythm.     Heart sounds: No murmur heard.    No friction rub. No gallop.  Pulmonary:     Effort: Pulmonary effort is normal.     Breath sounds: Normal breath sounds.  Abdominal:     General: Bowel sounds are normal.     Palpations: Abdomen is soft.     Comments: No significant tenderness to palpation of the abdomen, no  rebound, rigidity, guarding  Skin:    General: Skin is warm and dry.     Capillary Refill: Capillary refill takes less than 2 seconds.  Neurological:     Mental Status: She is alert and oriented to person, place, and time.  Psychiatric:        Mood and Affect: Mood normal.        Behavior: Behavior normal.     ED Results / Procedures / Treatments   Labs (all labs ordered are listed, but only abnormal results are displayed) Labs Reviewed  URINALYSIS, ROUTINE W REFLEX MICROSCOPIC - Abnormal; Notable for the following components:      Result Value   APPearance CLOUDY (*)    Hgb urine dipstick LARGE (*)    Protein, ur 30 (*)    Leukocytes,Ua LARGE (*)    RBC / HPF >50 (*)    WBC, UA >50 (*)    All other components within normal limits  URINE CULTURE    EKG None  Radiology No results  found.  Procedures Procedures    Medications Ordered in ED Medications - No data to display  ED Course/ Medical Decision Making/ A&P                           Medical Decision Making Amount and/or Complexity of Data Reviewed Labs: ordered.  This patient is a 72 y.o. female who presents to the ED for concern of urinary frequency, urinary pressure, without feeling incomplete emptying.   Differential diagnoses prior to evaluation: urinary Tract infection, pyelonephritis, nephrolithiasis, urinary retention, versus other  Past Medical History / Social History / Additional history: Chart reviewed. Pertinent results include: Hypertension, hyperlipidemia, CKD  Physical Exam: Physical exam performed. The pertinent findings include: Patient with no tenderness to palpation of the abdomen, is overall well-appearing, with stable vital signs, she is somewhat hypertensive on arrival, blood pressure 180/76 but improved to 160/64 prior to discharge, she denies any chest pain, shortness of breath  Medications / Treatment: Will discharge with antibiotics for urinary tract infection, and send urine for culture to ensure she has appropriate susceptibility   Disposition: After consideration of the diagnostic results and the patients response to treatment, I feel that patient stable for discharge with simple cystitis, encouraged PCP follow-up, will send urine for culture, extensive return precautions given.   emergency department workup does not suggest an emergent condition requiring admission or immediate intervention beyond what has been performed at this time. The plan is: as above. The patient is safe for discharge and has been instructed to return immediately for worsening symptoms, change in symptoms or any other concerns.  Final Clinical Impression(s) / ED Diagnoses Final diagnoses:  Acute cystitis without hematuria    Rx / DC Orders ED Discharge Orders          Ordered    cefadroxil  (DURICEF) 500 MG capsule  2 times daily        10/24/22 0924              Anselmo Pickler, PA-C 10/24/22 0930    Tegeler, Gwenyth Allegra, MD 10/24/22 [phone]

## 2022-10-25 LAB — URINE CULTURE: Culture: 10000 — AB

## 2022-10-28 ENCOUNTER — Other Ambulatory Visit: Payer: Self-pay | Admitting: Internal Medicine

## 2022-10-30 DIAGNOSIS — S92154A Nondisplaced avulsion fracture (chip fracture) of right talus, initial encounter for closed fracture: Secondary | ICD-10-CM | POA: Diagnosis not present

## 2022-10-30 DIAGNOSIS — S99921A Unspecified injury of right foot, initial encounter: Secondary | ICD-10-CM | POA: Diagnosis not present

## 2022-10-31 ENCOUNTER — Telehealth: Payer: Self-pay | Admitting: *Deleted

## 2022-10-31 NOTE — Telephone Encounter (Signed)
     Patient  visit on 10/24/2022  at [facility]  was for UTI  Have you been able to follow up with your primary care physician? Feelin better was seen at [facility] about a foot injury and is cleared antibiotics  The patient was  able to obtain any needed medicine or equipment.  Are there diet recommendations that you are having difficulty following?  Patient expresses understanding of discharge instructions and education provided has no other needs at this time.    [facility] [phone] [address] , [place] [address] Email : [email or web address]

## 2022-11-01 DIAGNOSIS — M25571 Pain in right ankle and joints of right foot: Secondary | ICD-10-CM | POA: Diagnosis not present

## 2022-11-02 ENCOUNTER — Telehealth: Payer: Self-pay | Admitting: Internal Medicine

## 2022-11-02 MED ORDER — REPATHA SURECLICK 140 MG/ML ~~LOC~~ SOAJ: 1.0000 mL | SUBCUTANEOUS | 3 refills | Status: DC

## 2022-11-02 NOTE — Telephone Encounter (Signed)
[employer] medicare changed formulary from [employer] >> [place] for 2024. Patient called w/this notice. Advised will need to change PCSK9i. Rx(s) sent to pharmacy electronically. She has a f/u on 11/18/22

## 2022-11-02 NOTE — Telephone Encounter (Signed)
PA for [employer] approved until 06/38/68

## 2022-11-09 ENCOUNTER — Encounter: Payer: Self-pay | Admitting: Internal Medicine

## 2022-11-09 ENCOUNTER — Ambulatory Visit: Payer: Medicare HMO | Attending: Internal Medicine | Admitting: Internal Medicine

## 2022-11-09 VITALS — BP 138/70 | HR 55 | Ht 67.0 in | Wt 112.6 lb

## 2022-11-09 DIAGNOSIS — M791 Myalgia, unspecified site: Secondary | ICD-10-CM

## 2022-11-09 DIAGNOSIS — I6523 Occlusion and stenosis of bilateral carotid arteries: Secondary | ICD-10-CM | POA: Diagnosis not present

## 2022-11-09 DIAGNOSIS — G458 Other transient cerebral ischemic attacks and related syndromes: Secondary | ICD-10-CM | POA: Diagnosis not present

## 2022-11-09 DIAGNOSIS — T466X5A Adverse effect of antihyperlipidemic and antiarteriosclerotic drugs, initial encounter: Secondary | ICD-10-CM

## 2022-11-09 DIAGNOSIS — T466X5D Adverse effect of antihyperlipidemic and antiarteriosclerotic drugs, subsequent encounter: Secondary | ICD-10-CM

## 2022-11-09 DIAGNOSIS — E7849 Other hyperlipidemia: Secondary | ICD-10-CM

## 2022-11-09 NOTE — Progress Notes (Signed)
LIPID CLINIC CONSULT NOTE  Chief Complaint:  Follow-up dyslipidemia  Primary Care Physician: Biagio Borg, MD  Primary Cardiologist:  Pixie Casino, MD  HPI:  Jennifer Andrade is a 72 y.o. female who is being seen today for the evaluation of dyslipidemia at the request of Biagio Borg, MD.  This is a 72 year old female kindly referred by Dr. Jenny Reichmann for evaluation management of marked dyslipidemia.  Her recent lipid profile showed total cholesterol 362, HDL 48, LDL 285 and triglycerides 147.  She reports longstanding elevated cholesterol despite eating very little as far as unhealthy foods or high saturated fats.  Her BMI is only 18.  There is a strong family history of heart disease including her father who has had 3 MIs and had CABG starting in his 35s.  She also has 2 half-sisters who have coronary disease.  In addition she has known ASCVD with mild to moderate bilateral carotid artery disease.  Fortunately, she cannot tolerate statins due to significant myalgias and mental status changes.  Other medical problems include hypertension, hypothyroidism and stage III chronic kidney disease.  08/12/2020  [name] returns today for follow-up of dyslipidemia.  She has had an excellent response to Praluent 150 mg every 2 weeks.  Her lipid profile is substantially improved now showing total cholesterol 219, HDL 56, LDL 142 and triglycerides 116.  This is down from a total cholesterol 362 in the past and LDL of 285.  Certainly she has a familial hyperlipidemia with strong family history of heart disease in her father with early MIs in his 33s.  She has no known history of events, however.  It would be ideal to get her cholesterol lower if possible.  She reports no side effects on the PCSK9 inhibitor.  Previously, she had intolerances to atorvastatin and rosuvastatin although the rosuvastatin was possibly associated with some memory issues at higher doses.  We discussed about the possibility of trying a  lower dose.  01/11/2021  [name] is seen today in follow-up.  Her lipids are incrementally improved somewhat, however unfortunately not tolerate rosuvastatin.  She is felt like she had some memory fog.  Her LDL cholesterol is lower though down to 126 from 142.  Blood pressure was elevated today.  She remains on Praluent which has brought her LDL down from 285 previously.  Although she has had a 50% reduction, given her significant carotid artery burden, would like to see if we could lower her cholesterol even further.  She says in the past she was very tolerant of ezetimibe however stopped taking the medication because the cost went up to $800 a month.  Since then the medicine is now generic and it may be a good option for her.  11/09/2022  [name] returns today for follow-up.  She has not had repeat lipid testing since starting ezetimibe.  Her last cholesterol showed total 177, triglycerides 92, HDL 71 and LDL 89.  She reports compliance with Praluent however her insurance recently required a switch to [place].  She has received the medicine but not started taking it.  She is not fasting today and we will need to reassess her lipids.  She was noted to be bradycardic and an EKG was performed today.  This shows a sinus bradycardia without any ischemic changes.  PMHx:  Past Medical History:  Diagnosis Date   ANEMIA-IRON DEFICIENCY 04/15/2008   Cataract    CKD (chronic kidney disease) stage 3, GFR 30-59 ml/min (HCC) 03/21/2019  Heart murmur    as a child   HYPERLIPIDEMIA 04/15/2008   HYPERTENSION 04/15/2008   HYPOTHYROIDISM 04/15/2008   LUMBAR RADICULOPATHY, LEFT 11/24/2009   Migraine 03/25/2011   OSTEOPENIA 04/15/2008   Other specified forms of hearing loss 09/30/2010   Phlebitis 1974   when pregnant   SINUSITIS- ACUTE-NOS 09/30/2010   Vitamin D deficiency 10/2011   level 16    Past Surgical History:  Procedure Laterality Date   BACK SURGERY     cataract surg     lens replacement Bil    tooth removal  2002   TUBAL LIGATION      FAMHx:  Family History  Problem Relation Age of Onset   Heart disease Father    Kidney disease Father    Hypertension Father    Thyroid disease Mother    Colon cancer Mother    Esophageal cancer Neg Hx    Rectal cancer Neg Hx    Stomach cancer Neg Hx    Inflammatory bowel disease Neg Hx    Liver disease Neg Hx    Pancreatic cancer Neg Hx     SOCHx:   reports that she quit smoking about 6 years ago. Her smoking use included cigarettes. She has a 47.00 pack-year smoking history. She has never used smokeless tobacco. She reports current alcohol use of about 1.0 standard drink of alcohol per week. She reports that she does not use drugs.  ALLERGIES:  Allergies  Allergen Reactions   Atorvastatin     REACTION: myalgias   Rosuvastatin     REACTION: memory problem   Shellfish-Derived Products Other (See Comments)   Sulfa Antibiotics Other (See Comments)    Pt fearful to take after daughter with steven johnson    ROS: Pertinent items noted in HPI and remainder of comprehensive ROS otherwise negative.  HOME MEDS: Current Outpatient Medications on File Prior to Visit  Medication Sig Dispense Refill   amLODipine (NORVASC) 5 MG tablet TAKE 1 TABLET (5 MG TOTAL) BY MOUTH DAILY. 90 tablet 3   cefadroxil (DURICEF) 500 MG capsule Take 1 capsule (500 mg total) by mouth 2 (two) times daily. 14 capsule 0   Cholecalciferol 50 MCG (2000 UT) TABS 1 ta by mouth once daily 30 tablet 99   Evolocumab (REPATHA SURECLICK) 948 MG/ML SOAJ Inject 140 mg into the skin every 14 (fourteen) days. 6 mL 3   ezetimibe (ZETIA) 10 MG tablet TAKE 1 TABLET BY MOUTH EVERY DAY 90 tablet 3   hydrocortisone (ANUSOL-HC) 2.5 % rectal cream PLACE 1 APPLICATION RECTALLY 2 (TWO) TIMES DAILY. 30 g 0   levothyroxine (SYNTHROID) 50 MCG tablet TAKE 1 TABLET BY MOUTH EVERY DAY 90 tablet 3   metoprolol succinate (TOPROL-XL) 100 MG 24 hr tablet TAKE 1 TABLET BY MOUTH DAILY. TAKE WITH  OR IMMEDIATELY FOLLOWING A MEAL. 90 tablet 3   olmesartan (BENICAR) 40 MG tablet TAKE 1 TABLET BY MOUTH EVERY DAY 90 tablet 3   No current facility-administered medications on file prior to visit.    LABS/IMAGING: No results found for this or any previous visit (from the past 48 hour(s)). No results found.  LIPID PANEL:    Component Value Date/Time   CHOL 177 08/16/2022 0904   TRIG 92 08/16/2022 0904   HDL 71 08/16/2022 0904   CHOLHDL 2.5 08/16/2022 0904   CHOLHDL 8 03/19/2019 1001   VLDL 29.4 03/19/2019 1001   LDLCALC 89 08/16/2022 0904   LDLDIRECT 195.2 03/22/2011 0759    WEIGHTS: Wt  Readings from Last 3 Encounters:  11/09/22 112 lb 9.6 oz (51.1 kg)  10/24/22 113 lb (51.3 kg)  07/19/22 111 lb (50.3 kg)    VITALS: BP 138/70   Pulse (!) 55   Ht '5\' 7"'$  (1.702 m)   Wt 112 lb 9.6 oz (51.1 kg)   LMP 10/24/2001   SpO2 95%   BMI 17.64 kg/m   EXAM: Deferred  EKG: Sinus bradycardia at 57-personally reviewed  ASSESSMENT: Probable familial hyperlipidemia - Dutch score of 7 ASCVD-bilateral carotid artery disease CKD 3 Intolerance-myalgias Family history of premature onset coronary disease in father  PLAN: 1.   Ms. Daubert has been tolerating ezetimibe in addition to her PCSK9 inhibitor.  Per insurance she is switching from Praluent to [place].  We will go ahead and repeat her lipid profile to see the effects of adding ezetimibe as to whether or not she has reached her target LDL less than 70.  Plan continue follow-up in 1 year or sooner as necessary with repeat lipids.  Pixie Casino, MD, [facility], [place] Director of the Advanced Lipid Disorders &  Cardiovascular Risk Reduction Clinic Diplomate of the American Board of Clinical Lipidology Attending Cardiologist  Direct Dial: [phone]  Fax: [phone]  Website:  [email or web address] 11/09/2022, 11:20 AM

## 2022-11-09 NOTE — Patient Instructions (Signed)
Medication Instructions:  NO CHANGES  *If you need a refill on your cardiac medications before your next appointment, please call your pharmacy*   Lab Work: FASTING NMR lipoprofile as soon as able   If you have labs (blood work) drawn today and your tests are completely normal, you will receive your results only by: [place] (if you have MyChart) OR A paper copy in the mail If you have any lab test that is abnormal or we need to change your treatment, we will call you to review the results.   Follow-Up: At [facility], you and your health needs are our priority.  As part of our continuing mission to provide you with exceptional heart care, we have created designated Provider Care Teams.  These Care Teams include your primary Cardiologist (physician) and Advanced Practice Providers (APPs -  Physician Assistants and Nurse Practitioners) who all work together to provide you with the care you need, when you need it.  We recommend signing up for the patient portal called "MyChart".  Sign up information is provided on this After Visit Summary.  MyChart is used to connect with patients for Virtual Visits (Telemedicine).  Patients are able to view lab/test results, encounter notes, upcoming appointments, etc.  Non-urgent messages can be sent to your provider as well.   To learn more about what you can do with MyChart, go to [email or web address].    Your next appointment:    6 months with Dr. Debara Pickett  ** call by end of January for appointment

## 2022-11-18 ENCOUNTER — Ambulatory Visit: Payer: Medicare HMO | Admitting: Internal Medicine

## 2022-12-28 ENCOUNTER — Ambulatory Visit: Payer: Medicare HMO | Admitting: Internal Medicine

## 2023-01-05 ENCOUNTER — Ambulatory Visit (INDEPENDENT_AMBULATORY_CARE_PROVIDER_SITE_OTHER): Payer: Medicare HMO | Admitting: Family Medicine

## 2023-01-05 ENCOUNTER — Encounter: Payer: Self-pay | Admitting: Family Medicine

## 2023-01-05 VITALS — BP 132/80 | HR 64 | Temp 97.6°F | Ht 67.0 in | Wt 110.0 lb

## 2023-01-05 DIAGNOSIS — R3989 Other symptoms and signs involving the genitourinary system: Secondary | ICD-10-CM | POA: Diagnosis not present

## 2023-01-05 DIAGNOSIS — R350 Frequency of micturition: Secondary | ICD-10-CM

## 2023-01-05 DIAGNOSIS — R300 Dysuria: Secondary | ICD-10-CM

## 2023-01-05 DIAGNOSIS — L821 Other seborrheic keratosis: Secondary | ICD-10-CM | POA: Insufficient documentation

## 2023-01-05 DIAGNOSIS — R3915 Urgency of urination: Secondary | ICD-10-CM

## 2023-01-05 DIAGNOSIS — D225 Melanocytic nevi of trunk: Secondary | ICD-10-CM | POA: Insufficient documentation

## 2023-01-05 LAB — POC URINALSYSI DIPSTICK (AUTOMATED)
Bilirubin, UA: NEGATIVE
Blood, UA: NEGATIVE
Glucose, UA: NEGATIVE
Ketones, UA: NEGATIVE
Leukocytes, UA: NEGATIVE
Nitrite, UA: NEGATIVE
Protein, UA: NEGATIVE
Spec Grav, UA: >=1.03 — AB (ref 1.010–1.025)
Urobilinogen, UA: 0.2 E.U./dL
pH, UA: 5.5 (ref 5.0–8.0)

## 2023-01-05 NOTE — Patient Instructions (Signed)
Your urinalysis is negative for infection  Continue to drink plenty of water.  If your symptoms return, please send a MyChart message or call us and I will send in a refill of the cefadroxil.

## 2023-01-05 NOTE — Progress Notes (Signed)
Subjective:  Jennifer Andrade is a 72 y.o. female who complains of possible urinary tract infection.  She had symptoms for 2 days.  Symptoms included  urinary urgency, dysuria and cloudy urine . Patient denies  fever, chills, abdominal pain, back pain, N/V/D .  Last UTI was in January 2024.   States she had leftover antibiotics and she took  3 doses of cefadroxil 500 mg. Her symptoms completely resolved.   Patient does not have a history of recurrent UTI. Patient does not have a history of pyelonephritis.  No other aggravating or relieving factors.  No other c/o.  Past Medical History:  Diagnosis Date   ANEMIA-IRON DEFICIENCY 04/15/2008   Cataract    CKD (chronic kidney disease) stage 3, GFR 30-59 ml/min (HCC) 03/21/2019   Heart murmur    as a child   HYPERLIPIDEMIA 04/15/2008   HYPERTENSION 04/15/2008   HYPOTHYROIDISM 04/15/2008   LUMBAR RADICULOPATHY, LEFT 11/24/2009   Migraine 03/25/2011   OSTEOPENIA 04/15/2008   Other specified forms of hearing loss 09/30/2010   Phlebitis 1974   when pregnant   SINUSITIS- ACUTE-NOS 09/30/2010   Vitamin D deficiency 10/2011   level 16    ROS as in subjective  Reviewed allergies, medications, past medical, surgical, and social history.    Objective: Vitals:   01/05/23 0821  BP: 132/80  Pulse: 64  Temp: 97.6 F (36.4 C)  SpO2: 97%    General appearance: alert, no distress, WD/WN, female Abdomen: +bs, soft, non tender, non distended Back: no CVA tenderness GU: deferred     Laboratory:  Urine dipstick: negative for all components.       Assessment: Dysuria  Urinary frequency - Plan: POCT Urinalysis Dipstick (Automated)  Urinary urgency - Plan: POCT Urinalysis Dipstick (Automated)  Sensation of pressure in bladder area - Plan: POCT Urinalysis Dipstick (Automated)   Plan: No sign of infection today. She feels at baseline. Urinary symptoms resolved.   Advised increased water intake.  She will call or message [place] if her symptoms return  and I will send in a refill of cefadroxil 500 mg.  She is pleased with this plan.   Urine culture not sent.

## 2023-02-16 ENCOUNTER — Telehealth: Payer: Self-pay | Admitting: Licensed Clinical Social Worker

## 2023-02-16 NOTE — Patient Outreach (Signed)
  Care Coordination   02/16/2023 Name: Jennifer Andrade MRN: [id number] DOB: 12-Feb-1951   Care Coordination Outreach Attempts:  An unsuccessful telephone outreach was attempted today to offer the patient information about available care coordination services as a benefit of their health plan.   Follow Up Plan:  Additional outreach attempts will be made to offer the patient care coordination information and services.   Encounter Outcome:  No Answer   Care Coordination Interventions:  No, not indicated    Sammuel Hines, LCSW Social Work Care Coordination  [facility] [employer] [phone]

## 2023-02-27 ENCOUNTER — Telehealth: Payer: Self-pay | Admitting: Internal Medicine

## 2023-02-27 DIAGNOSIS — E2839 Other primary ovarian failure: Secondary | ICD-10-CM

## 2023-02-27 NOTE — Telephone Encounter (Signed)
[place] from [employer] called to check and see if Dr. Jonny Ruiz had put orders in for the patient to get a bone density test. She said she sent a fax 12/27/22 to Dr. Jonny Ruiz abut it. Best callback for her is [phone].

## 2023-02-28 ENCOUNTER — Ambulatory Visit: Payer: Self-pay | Admitting: Licensed Clinical Social Worker

## 2023-02-28 NOTE — Telephone Encounter (Signed)
Pt refuse to making appt for bone density...Jennifer Andrade

## 2023-02-28 NOTE — Patient Outreach (Signed)
  Care Coordination  Initial Visit Note   02/28/2023 Name: Jennifer Andrade MRN: [id number] DOB: 12/29/1950  Jennifer Andrade AGE is a 72 y.o. year old female who sees Corwin Levins, MD for primary care. I spoke with  Nicole Kindred by phone today.  What matters to the patients health and wellness today?  Patient reports no concerns or needs from Care Coordination team with health and wellness related to physical or mental heath. .               Goals Addressed             This Visit's Progress    COMPLETED: Care Coordination Services       You declined care coordination, however agreed to review information in the after visit summary of MyChart         SDOH assessments and interventions completed:  No   Care Coordination Interventions:  Yes, provided  Interventions Today    Flowsheet Row Most Recent Value  Chronic Disease   Chronic disease during today's visit Hypertension (HTN), Chronic Kidney Disease/End Stage Renal Disease (ESRD)  General Interventions   General Interventions Discussed/Reviewed General Interventions Discussed  [care coordination program]       Follow up plan: No further intervention required.   Encounter Outcome:  Pt. Visit Completed   Sammuel Hines, LCSW Social Work Care Coordination  [facility] [employer] [phone]

## 2023-02-28 NOTE — Patient Instructions (Signed)
Visit Information  Thank you for taking time to visit with me today. Please don't hesitate to contact me if I can be of assistance to you.   Following are the goals we discussed today:   Goals Addressed             This Visit's Progress    COMPLETED: Care Coordination Services       You declined care coordination, however agreed to review information in the after visit summary of MyChart          Care Coordination provides support specific to your health needs that extend beyond exceptional routine office care you already receive from your primary care doctor.    If you are eligible for standard Care Coordination, there is no cost to you.  The Care Coordination team is made up of the following team members: Registered Nurse Care Guide: disease management, health education, care coordination and complex case management Clinical Social Work: Complex Care Coordination including coordination of level of care needs, mental and behavioral health assessment and recommendations, and connection to long-term mental health support Clinical Pharmacist: medication management, assistance and disease management Community Resource Care Guides: Armed forces operational officer Team: dedicated team of scheduling professionals to support patient and clinical team scheduling needs  Please call [phone] if you would like to schedule a phone appointment with one of the team members.   Patient verbalizes understanding of instructions and care plan provided today and agrees to view in MyChart. Active MyChart status and patient understanding of how to access instructions and care plan via MyChart confirmed with patient.     No further follow up required: Care Coordination at this time  Sammuel Hines, [employer] Social Work Care Coordination  [facility] [employer] [phone]

## 2023-02-28 NOTE — Telephone Encounter (Signed)
Unfortunately I dont see any documentation of a previous request  But ok - I have ordered  Staff to please assist pt with scheduling at the [facility] xray

## 2023-04-20 ENCOUNTER — Ambulatory Visit (INDEPENDENT_AMBULATORY_CARE_PROVIDER_SITE_OTHER): Payer: Medicare HMO | Admitting: Family Medicine

## 2023-04-20 ENCOUNTER — Encounter: Payer: Self-pay | Admitting: Family Medicine

## 2023-04-20 VITALS — BP 132/84 | HR 76 | Temp 97.4°F | Ht 67.0 in | Wt 106.0 lb

## 2023-04-20 DIAGNOSIS — N3001 Acute cystitis with hematuria: Secondary | ICD-10-CM | POA: Diagnosis not present

## 2023-04-20 DIAGNOSIS — R300 Dysuria: Secondary | ICD-10-CM

## 2023-04-20 DIAGNOSIS — R350 Frequency of micturition: Secondary | ICD-10-CM | POA: Diagnosis not present

## 2023-04-20 LAB — POC URINALSYSI DIPSTICK (AUTOMATED)
Bilirubin, UA: NEGATIVE
Blood, UA: POSITIVE
Glucose, UA: NEGATIVE
Ketones, UA: NEGATIVE
Nitrite, UA: NEGATIVE
Protein, UA: POSITIVE — AB
Spec Grav, UA: >=1.03 — AB (ref 1.010–1.025)
Urobilinogen, UA: 0.2 E.U./dL
pH, UA: 5.5 (ref 5.0–8.0)

## 2023-04-20 MED ORDER — CEPHALEXIN 500 MG PO CAPS: 500.0000 mg | ORAL_CAPSULE | Freq: Two times a day (BID) | ORAL | 0 refills | Status: DC

## 2023-04-20 NOTE — Progress Notes (Signed)
Subjective:  Jennifer Andrade is a 72 y.o. female who complains of possible urinary tract infection.  She has had symptoms for a few  hours ago .  Symptoms include  urinary frequency, dysuria and pelvic pressure . Patient denies  fever, chills, abdominal pain, back pain, N/V. Diarrhea recently .    States she will get labs with her cardiologist soon.   Past Medical History:  Diagnosis Date   ANEMIA-IRON DEFICIENCY 04/15/2008   Cataract    CKD (chronic kidney disease) stage 3, GFR 30-59 ml/min (HCC) 03/21/2019   Heart murmur    as a child   HYPERLIPIDEMIA 04/15/2008   HYPERTENSION 04/15/2008   HYPOTHYROIDISM 04/15/2008   LUMBAR RADICULOPATHY, LEFT 11/24/2009   Migraine 03/25/2011   OSTEOPENIA 04/15/2008   Other specified forms of hearing loss 09/30/2010   Phlebitis 1974   when pregnant   SINUSITIS- ACUTE-NOS 09/30/2010   Vitamin D deficiency 10/2011   level 16    ROS as in subjective  Reviewed allergies, medications, past medical, surgical, and social history.    Objective: Vitals:   04/20/23 1505  BP: 132/84  Pulse: 76  Temp: (!) 97.4 F (36.3 C)  SpO2: 98%    General appearance: alert, no distress, WD/WN, female Abdomen: +bs, soft, non tender, non distended Back: no CVA tenderness      Laboratory:  Urine dipstick: sp gravity 1.030, 1+ for hemoglobin, and 2+ for leukocyte esterase.       Assessment: Acute cystitis with hematuria - Plan: Urine Culture, cephALEXin (KEFLEX) 500 MG capsule  Dysuria - Plan: POCT Urinalysis Dipstick (Automated), cephALEXin (KEFLEX) 500 MG capsule  Urinary frequency - Plan: cephALEXin (KEFLEX) 500 MG capsule   Plan: Discussed symptoms, diagnosis, possible complications, and usual course of illness.  Start Keflex.  Advised increased water intake, can use OTC Tylenol for pain.      Urine culture sent.  Call or return if worse or not improving.

## 2023-04-20 NOTE — Patient Instructions (Signed)
Increase water intake.  Start the antibiotic.  We will be in touch with your urine culture result.

## 2023-04-22 LAB — URINE CULTURE

## 2023-05-21 ENCOUNTER — Other Ambulatory Visit: Payer: Self-pay | Admitting: Internal Medicine

## 2023-05-29 ENCOUNTER — Other Ambulatory Visit: Payer: Self-pay | Admitting: Internal Medicine

## 2023-06-13 ENCOUNTER — Ambulatory Visit (INDEPENDENT_AMBULATORY_CARE_PROVIDER_SITE_OTHER): Payer: Medicare HMO | Admitting: Family Medicine

## 2023-06-13 ENCOUNTER — Encounter: Payer: Self-pay | Admitting: Family Medicine

## 2023-06-13 VITALS — BP 108/68 | HR 55 | Temp 97.8°F | Ht 67.0 in | Wt 104.1 lb

## 2023-06-13 DIAGNOSIS — N3000 Acute cystitis without hematuria: Secondary | ICD-10-CM

## 2023-06-13 DIAGNOSIS — R350 Frequency of micturition: Secondary | ICD-10-CM

## 2023-06-13 LAB — POCT URINALYSIS DIPSTICK
Bilirubin, UA: NEGATIVE
Blood, UA: NEGATIVE
Glucose, UA: NEGATIVE
Ketones, UA: NEGATIVE
Nitrite, UA: NEGATIVE
Protein, UA: NEGATIVE
Spec Grav, UA: >=1.03 — AB (ref 1.010–1.025)
Urobilinogen, UA: 0.2 E.U./dL
pH, UA: 5.5 (ref 5.0–8.0)

## 2023-06-13 MED ORDER — CEFADROXIL 500 MG PO CAPS: 500.0000 mg | ORAL_CAPSULE | Freq: Two times a day (BID) | ORAL | 0 refills | Status: AC

## 2023-06-13 NOTE — Patient Instructions (Addendum)
-  Urinalysis show leukocytes that is indication for urinary tract infection. Will send urine for culture. Office will call with results and you may see them on MyChart.  -Prescribed Cefadroxil 500mg  tablet, 1 tablet twice a day for 5 days.  -Promote hydration.  -If symptoms were to get worse, follow up. If symptoms get worse after hours, please go to the emergency department.

## 2023-06-13 NOTE — Progress Notes (Signed)
Acute Office Visit   Subjective:  Patient ID: Jennifer Andrade, female    DOB: 15-May-1951, 72 y.o.   MRN: [id number]  Chief Complaint  Patient presents with   Urinary Frequency    Pt reports sx stated last night -Pressure when urination and frequency.     Urinary Frequency  Associated symptoms include frequency.   Patient is a 72 year old female that presents with pressure when urinating,  and increase frequency/urgency.   Denies abd pain, lower back pain, nausea, vomiting, hematuria, or fever.    Symptoms started yesterday evening.  Denies taking any medication for symptoms.  Last UTI was 04/20/2023. Last antibiotic was Keflex.  Review of Systems  Genitourinary:  Positive for frequency.   See HPI above      Objective:   BP 108/68   Pulse (!) 55   Temp 97.8 F (36.6 C)   Ht 5\' 7"  (1.702 m)   Wt 104 lb 2 oz (47.2 kg)   LMP 10/24/2001   SpO2 97%   BMI 16.31 kg/m    Physical Exam Vitals reviewed.  Constitutional:      General: She is not in acute distress.    Appearance: Normal appearance. She is not ill-appearing, toxic-appearing or diaphoretic.  HENT:     Head: Normocephalic and atraumatic.  Eyes:     General:        Right eye: No discharge.        Left eye: No discharge.     Conjunctiva/sclera: Conjunctivae normal.  Cardiovascular:     Rate and Rhythm: Normal rate and regular rhythm.     Heart sounds: Normal heart sounds. No murmur heard.    No friction rub. No gallop.  Pulmonary:     Effort: Pulmonary effort is normal. No respiratory distress.     Breath sounds: Normal breath sounds.  Musculoskeletal:        General: Normal range of motion.  Skin:    General: Skin is warm and dry.  Neurological:     General: No focal deficit present.     Mental Status: She is alert and oriented to person, place, and time. Mental status is at baseline.  Psychiatric:        Mood and Affect: Mood normal.        Behavior: Behavior normal.        Thought Content:  Thought content normal.        Judgment: Judgment normal.    Results for orders placed or performed in visit on 06/13/23  POCT Urinalysis Dipstick  Result Value Ref Range   Color, UA     Clarity, UA     Glucose, UA Negative Negative   Bilirubin, UA NEG    Ketones, UA NEG    Spec Grav, UA >=1.030 (A) 1.010 - 1.025   Blood, UA NEG    pH, UA 5.5 5.0 - 8.0   Protein, UA Negative Negative   Urobilinogen, UA 0.2 0.2 or 1.0 E.U./dL   Nitrite, UA NEG    Leukocytes, UA 4+ (A) Negative   Appearance     Odor         Assessment & Plan:  Urine frequency -     POCT urinalysis dipstick -     Urine Culture  Acute cystitis without hematuria -     Cefadroxil; Take 1 capsule (500 mg total) by mouth 2 (two) times daily for 5 days.  Dispense: 10 capsule; Refill: 0  -Urinalysis show leukocytes  that is indication for urinary tract infection. Will send urine for culture.  -Prescribed Cefadroxil 500mg  tablet, 1 tablet twice a day for 5 days. She reports she took this medication in January for UTI and it did well.  -Promote hydration.  -If symptoms were to get worse, follow up. If symptoms get worse after hours, please go to the emergency department.   Zandra Abts, NP

## 2023-06-15 ENCOUNTER — Telehealth: Payer: Self-pay

## 2023-06-15 LAB — URINE CULTURE
MICRO NUMBER:: 15356371
SPECIMEN QUALITY:: ADEQUATE

## 2023-06-15 NOTE — Telephone Encounter (Signed)
-----   Message from Zandra Abts sent at 06/15/2023  4:08 PM EDT ----- Your urine culture shows E. Coli. The antibiotic you are taking will cover infection. Follow up if not improved after taking course of antibiotic.

## 2023-06-27 DIAGNOSIS — L821 Other seborrheic keratosis: Secondary | ICD-10-CM | POA: Diagnosis not present

## 2023-06-27 DIAGNOSIS — D225 Melanocytic nevi of trunk: Secondary | ICD-10-CM | POA: Diagnosis not present

## 2023-06-27 DIAGNOSIS — L578 Other skin changes due to chronic exposure to nonionizing radiation: Secondary | ICD-10-CM | POA: Diagnosis not present

## 2023-06-27 DIAGNOSIS — L853 Xerosis cutis: Secondary | ICD-10-CM | POA: Diagnosis not present

## 2023-07-09 ENCOUNTER — Ambulatory Visit (HOSPITAL_COMMUNITY): Payer: Self-pay

## 2023-07-10 ENCOUNTER — Ambulatory Visit (INDEPENDENT_AMBULATORY_CARE_PROVIDER_SITE_OTHER): Payer: Medicare HMO | Admitting: Internal Medicine

## 2023-07-10 ENCOUNTER — Encounter: Payer: Self-pay | Admitting: Internal Medicine

## 2023-07-10 ENCOUNTER — Other Ambulatory Visit: Payer: Self-pay | Admitting: Internal Medicine

## 2023-07-10 VITALS — BP 124/72 | HR 53 | Temp 97.5°F | Ht 67.0 in | Wt 104.0 lb

## 2023-07-10 DIAGNOSIS — R739 Hyperglycemia, unspecified: Secondary | ICD-10-CM | POA: Diagnosis not present

## 2023-07-10 DIAGNOSIS — E7800 Pure hypercholesterolemia, unspecified: Secondary | ICD-10-CM

## 2023-07-10 DIAGNOSIS — E039 Hypothyroidism, unspecified: Secondary | ICD-10-CM | POA: Diagnosis not present

## 2023-07-10 DIAGNOSIS — E538 Deficiency of other specified B group vitamins: Secondary | ICD-10-CM | POA: Diagnosis not present

## 2023-07-10 DIAGNOSIS — Z0000 Encounter for general adult medical examination without abnormal findings: Secondary | ICD-10-CM | POA: Diagnosis not present

## 2023-07-10 DIAGNOSIS — N1831 Chronic kidney disease, stage 3a: Secondary | ICD-10-CM

## 2023-07-10 DIAGNOSIS — D509 Iron deficiency anemia, unspecified: Secondary | ICD-10-CM

## 2023-07-10 DIAGNOSIS — E559 Vitamin D deficiency, unspecified: Secondary | ICD-10-CM

## 2023-07-10 DIAGNOSIS — I10 Essential (primary) hypertension: Secondary | ICD-10-CM

## 2023-07-10 DIAGNOSIS — Z0001 Encounter for general adult medical examination with abnormal findings: Secondary | ICD-10-CM

## 2023-07-10 DIAGNOSIS — R350 Frequency of micturition: Secondary | ICD-10-CM

## 2023-07-10 LAB — HEPATIC FUNCTION PANEL
ALT: 13 U/L (ref 0–35)
AST: 20 U/L (ref 0–37)
Albumin: 4.6 g/dL (ref 3.5–5.2)
Alkaline Phosphatase: 34 U/L — ABNORMAL LOW (ref 39–117)
Bilirubin, Direct: 0.1 mg/dL (ref 0.0–0.3)
Total Bilirubin: 0.6 mg/dL (ref 0.2–1.2)
Total Protein: 7.7 g/dL (ref 6.0–8.3)

## 2023-07-10 LAB — URINALYSIS, ROUTINE W REFLEX MICROSCOPIC
Bilirubin Urine: NEGATIVE
Hgb urine dipstick: NEGATIVE
Ketones, ur: NEGATIVE
Nitrite: NEGATIVE
Specific Gravity, Urine: 1.025 (ref 1.000–1.030)
Total Protein, Urine: NEGATIVE
Urine Glucose: NEGATIVE
Urobilinogen, UA: 0.2 (ref 0.0–1.0)
pH: 5.5 (ref 5.0–8.0)

## 2023-07-10 LAB — FERRITIN: Ferritin: 166.8 ng/mL (ref 10.0–291.0)

## 2023-07-10 LAB — CBC WITH DIFFERENTIAL/PLATELET
Basophils Absolute: 0 10*3/uL (ref 0.0–0.1)
Basophils Relative: 0.7 % (ref 0.0–3.0)
Eosinophils Absolute: 0.2 10*3/uL (ref 0.0–0.7)
Eosinophils Relative: 2.3 % (ref 0.0–5.0)
HCT: 43.1 % (ref 36.0–46.0)
Hemoglobin: 14 g/dL (ref 12.0–15.0)
Lymphocytes Relative: 20.3 % (ref 12.0–46.0)
Lymphs Abs: 1.4 10*3/uL (ref 0.7–4.0)
MCHC: 32.4 g/dL (ref 30.0–36.0)
MCV: 95.6 fl (ref 78.0–100.0)
Monocytes Absolute: 0.8 10*3/uL (ref 0.1–1.0)
Monocytes Relative: 11.7 % (ref 3.0–12.0)
Neutro Abs: 4.4 10*3/uL (ref 1.4–7.7)
Neutrophils Relative %: 65 % (ref 43.0–77.0)
Platelets: 216 10*3/uL (ref 150.0–400.0)
RBC: 4.51 Mil/uL (ref 3.87–5.11)
RDW: 14.3 % (ref 11.5–15.5)
WBC: 6.8 10*3/uL (ref 4.0–10.5)

## 2023-07-10 LAB — BASIC METABOLIC PANEL
BUN: 30 mg/dL — ABNORMAL HIGH (ref 6–23)
CO2: 28 meq/L (ref 19–32)
Calcium: 10.2 mg/dL (ref 8.4–10.5)
Chloride: 101 meq/L (ref 96–112)
Creatinine, Ser: 1.22 mg/dL — ABNORMAL HIGH (ref 0.40–1.20)
GFR: 44.92 mL/min — ABNORMAL LOW (ref >60.00)
Glucose, Bld: 101 mg/dL — ABNORMAL HIGH (ref 70–99)
Potassium: 4.8 meq/L (ref 3.5–5.1)
Sodium: 139 meq/L (ref 135–145)

## 2023-07-10 LAB — LIPID PANEL
Cholesterol: 153 mg/dL (ref 0–200)
HDL: 69.9 mg/dL (ref >39.00)
LDL Cholesterol: 61 mg/dL (ref 0–99)
NonHDL: 82.69
Total CHOL/HDL Ratio: 2
Triglycerides: 107 mg/dL (ref 0.0–149.0)
VLDL: 21.4 mg/dL (ref 0.0–40.0)

## 2023-07-10 LAB — HEMOGLOBIN A1C: Hgb A1c MFr Bld: 6.1 % (ref 4.6–6.5)

## 2023-07-10 LAB — VITAMIN B12: Vitamin B-12: 451 pg/mL (ref 211–911)

## 2023-07-10 LAB — IBC PANEL
Iron: 132 ug/dL (ref 42–145)
Saturation Ratios: 33.1 % (ref 20.0–50.0)
TIBC: 399 ug/dL (ref 250.0–450.0)
Transferrin: 285 mg/dL (ref 212.0–360.0)

## 2023-07-10 LAB — VITAMIN D 25 HYDROXY (VIT D DEFICIENCY, FRACTURES): VITD: 24.4 ng/mL — ABNORMAL LOW (ref 30.00–100.00)

## 2023-07-10 LAB — TSH: TSH: 4.78 u[IU]/mL (ref 0.35–5.50)

## 2023-07-10 LAB — T4, FREE: Free T4: 1.18 ng/dL (ref 0.60–1.60)

## 2023-07-10 MED ORDER — EZETIMIBE 10 MG PO TABS: 10.0000 mg | ORAL_TABLET | Freq: Every day | ORAL | 3 refills | Status: DC

## 2023-07-10 MED ORDER — OLMESARTAN MEDOXOMIL 40 MG PO TABS: 40.0000 mg | ORAL_TABLET | Freq: Every day | ORAL | 3 refills | Status: DC

## 2023-07-10 MED ORDER — CIPROFLOXACIN HCL 500 MG PO TABS: 500.0000 mg | ORAL_TABLET | Freq: Two times a day (BID) | ORAL | 0 refills | Status: AC

## 2023-07-10 MED ORDER — LEVOTHYROXINE SODIUM 50 MCG PO TABS: 50.0000 ug | ORAL_TABLET | Freq: Every day | ORAL | 3 refills | Status: DC

## 2023-07-10 MED ORDER — METOPROLOL SUCCINATE ER 100 MG PO TB24: 50.0000 mg | ORAL_TABLET | Freq: Every day | ORAL | 3 refills | Status: DC

## 2023-07-10 MED ORDER — AMLODIPINE BESYLATE 5 MG PO TABS: 5.0000 mg | ORAL_TABLET | Freq: Every day | ORAL | 3 refills | Status: DC

## 2023-07-10 NOTE — Patient Instructions (Addendum)
Ok to cut back on the Toprol xl to HALF pill , so you would be taking 50 mg instead of the 100 mg per day  Please continue all other medications as before, and refills have been done if requested.  Please have the pharmacy call with any other refills you may need.  Please continue your efforts at being more active, low cholesterol diet, and weight control.  You are otherwise up to date with prevention measures today.  Please keep your appointments with your specialists as you may have planned  Please go to the LAB at the blood drawing area for the tests to be done  You will be contacted by phone if any changes need to be made immediately.  Otherwise, you will receive a letter about your results with an explanation, but please check with MyChart first.  Please remember to sign up for MyChart if you have not done so, as this will be important to you in the future with finding out test results, communicating by private email, and scheduling acute appointments online when needed.  Please make an Appointment to return in 6 months, or sooner if needed  Ok to cancel the Dec 2024 appt unless you wish to keep this.

## 2023-07-10 NOTE — Progress Notes (Unsigned)
Patient ID: Jennifer Andrade, female   DOB: 09/15/1951, 72 y.o.   MRN: [id number]         Chief Complaint:: wellness exam and Medication Problem (Thinks a medication is causing UTIs, wants to check for a uti e)  , fatigue, dizzy, htn       HPI:  Jennifer Andrade is a 72 y.o. female here for wellness exam; declines all vaccinations, declines LDCT screening and colonoscopy., o/w up to date               Also quit smoking x 6 yrs.  Pt denies chest pain, increased sob or doe, wheezing, orthopnea, PND, increased LE swelling, palpitations, dizziness or syncope.   Pt denies polydipsia, polyuria, or new focal neuro s/s.    Pt denies fever, wt loss, night sweats, loss of appetite, or other constitutional symptoms  Has worsening dizzy and fatigue recently also with lower abd pressure sensation and suspects she may have UTI.  Has had uti in the past without symptoms and is wary of any recurrence.  Bps also have been lower normal at times at home as well.     Wt Readings from Last 3 Encounters:  07/10/23 104 lb (47.2 kg)  06/13/23 104 lb 2 oz (47.2 kg)  04/20/23 106 lb (48.1 kg)   BP Readings from Last 3 Encounters:  07/10/23 124/72  06/13/23 108/68  04/20/23 132/84   Immunization History  Administered Date(s) Administered   PFIZER(Purple Top)SARS-COV-2 Vaccination 01/06/2020, 01/27/2020   Health Maintenance Due  Topic Date Due   DTaP/Tdap/Td (1 - Tdap) Never done   Zoster Vaccines- Shingrix (1 of 2) Never done   Colonoscopy  Never done   Lung Cancer Screening  Never done   Pneumonia Vaccine 14+ Years old (1 of 1 - PCV) Never done   DEXA SCAN  Never done   COVID-19 Vaccine (3 - Pfizer risk series) 02/24/2020   Medicare Annual Wellness (AWV)  09/13/2022      Past Medical History:  Diagnosis Date   ANEMIA-IRON DEFICIENCY 04/15/2008   Cataract    CKD (chronic kidney disease) stage 3, GFR 30-59 ml/min (HCC) 03/21/2019   Heart murmur    as a child   HYPERLIPIDEMIA 04/15/2008   HYPERTENSION  04/15/2008   HYPOTHYROIDISM 04/15/2008   LUMBAR RADICULOPATHY, LEFT 11/24/2009   Migraine 03/25/2011   OSTEOPENIA 04/15/2008   Other specified forms of hearing loss 09/30/2010   Phlebitis 1974   when pregnant   SINUSITIS- ACUTE-NOS 09/30/2010   Vitamin D deficiency 10/2011   level 16   Past Surgical History:  Procedure Laterality Date   BACK SURGERY     cataract surg     lens replacement Bil   tooth removal  2002   TUBAL LIGATION      reports that she quit smoking about 6 years ago. Her smoking use included cigarettes. She started smoking about 53 years ago. She has a 47 pack-year smoking history. She has never used smokeless tobacco. She reports current alcohol use of about 1.0 standard drink of alcohol per week. She reports that she does not use drugs. family history includes Colon cancer in her mother; Heart disease in her father; Hypertension in her father; Kidney disease in her father; Thyroid disease in her mother. Allergies  Allergen Reactions   Atorvastatin     REACTION: myalgias   Rosuvastatin     REACTION: memory problem   Shellfish-Derived Products Other (See Comments)   Sulfa Antibiotics Other (See Comments)  Pt fearful to take after daughter with Trudie Buckler   Current Outpatient Medications on File Prior to Visit  Medication Sig Dispense Refill   Cholecalciferol 50 MCG (2000 UT) TABS 1 ta by mouth once daily 30 tablet 99   Evolocumab (REPATHA SURECLICK) 140 MG/ML SOAJ Inject 140 mg into the skin every 14 (fourteen) days. 6 mL 3   hydrocortisone (ANUSOL-HC) 2.5 % rectal cream PLACE 1 APPLICATION RECTALLY 2 (TWO) TIMES DAILY. 30 g 0   No current facility-administered medications on file prior to visit.        ROS:  All others reviewed and negative.  Objective        PE:  BP 124/72 (BP Location: Right Arm, Patient Position: Sitting, Cuff Size: Normal)   Pulse (!) 53   Temp (!) 97.5 F (36.4 C) (Oral)   Ht 5\' 7"  (1.702 m)   Wt 104 lb (47.2 kg)   LMP 10/24/2001    SpO2 98%   BMI 16.29 kg/m                 Constitutional: Pt appears in NAD               HENT: Head: NCAT.                Right Ear: External ear normal.                 Left Ear: External ear normal.                Eyes: . Pupils are equal, round, and reactive to light. Conjunctivae and EOM are normal               Nose: without d/c or deformity               Neck: Neck supple. Gross normal ROM               Cardiovascular: Normal rate and regular rhythm.                 Pulmonary/Chest: Effort normal and breath sounds without rales or wheezing.                Abd:  Soft, NT, ND, + BS, no organomegaly               Neurological: Pt is alert. At baseline orientation, motor grossly intact               Skin: Skin is warm. No rashes, no other new lesions, LE edema - none               Psychiatric: Pt behavior is normal without agitation   Micro: none  Cardiac tracings I have personally interpreted today:  none  Pertinent Radiological findings (summarize): none   Lab Results  Component Value Date   WBC 6.8 07/10/2023   HGB 14.0 07/10/2023   HCT 43.1 07/10/2023   PLT 216.0 07/10/2023   GLUCOSE 101 (H) 07/10/2023   CHOL 153 07/10/2023   TRIG 107.0 07/10/2023   HDL 69.90 07/10/2023   LDLDIRECT 195.2 03/22/2011   LDLCALC 61 07/10/2023   ALT 13 07/10/2023   AST 20 07/10/2023   NA 139 07/10/2023   K 4.8 07/10/2023   CL 101 07/10/2023   CREATININE 1.22 (H) 07/10/2023   BUN 30 (H) 07/10/2023   CO2 28 07/10/2023   TSH 4.78 07/10/2023   HGBA1C 6.1 07/10/2023   Assessment/Plan:  Jennifer Andrade  is a 72 y.o. White or Caucasian [1] female with  has a past medical history of ANEMIA-IRON DEFICIENCY (04/15/2008), Cataract, CKD (chronic kidney disease) stage 3, GFR 30-59 ml/min (HCC) (03/21/2019), Heart murmur, HYPERLIPIDEMIA (04/15/2008), HYPERTENSION (04/15/2008), HYPOTHYROIDISM (04/15/2008), LUMBAR RADICULOPATHY, LEFT (11/24/2009), Migraine (03/25/2011), OSTEOPENIA (04/15/2008), Other  specified forms of hearing loss (09/30/2010), Phlebitis (1974), SINUSITIS- ACUTE-NOS (09/30/2010), and Vitamin D deficiency (10/2011).  Encounter for well adult exam with abnormal findings Age and sex appropriate education and counseling updated with regular exercise and diet Referrals for preventative services - declines dxa and colonoscopy Immunizations addressed - declines all Smoking counseling  - none needed Evidence for depression or other mood disorder - none significant Most recent labs reviewed. I have personally reviewed and have noted: 1) the patient's medical and social history 2) The patient's current medications and supplements 3) The patient's height, weight, and BMI have been recorded in the chart   ANEMIA-IRON DEFICIENCY No recent overt bleeding, for iron lab next draw  B12 deficiency Lab Results  Component Value Date   VITAMINB12 451 07/10/2023   Stable, cont oral replacement - b12 1000 mcg qd   CKD (chronic kidney disease) stage 3, GFR 30-59 ml/min (HCC) Lab Results  Component Value Date   CREATININE 1.22 (H) 07/10/2023   Stable overall, cont to avoid nephrotoxins   Essential hypertension BP Readings from Last 3 Encounters:  07/10/23 124/72  06/13/23 108/68  04/20/23 132/84   Fair control here today, but pt to continue medical treatment benicar 40 every day, but for decreased toprol xl 50 every day, and cont f//u at home   Hyperlipidemia Lab Results  Component Value Date   LDLCALC 61 07/10/2023   Stable, pt to continue current statin zetia 10 every day, repatha 140 mg   Hypothyroidism Lab Results  Component Value Date   TSH 4.78 07/10/2023   Stable, pt to continue levothyroxine 50 qd   Vitamin D deficiency Last vitamin D Lab Results  Component Value Date   VD25OH 24.40 (L) 07/10/2023   Low, to start oral replacement   Urinary frequency Also for ua with labs  Followup: Return in about 6 months (around 01/07/2024).  Oliver Barre, MD  07/13/2023 9:42 PM [facility]

## 2023-07-11 LAB — TIQ- MISLABELED

## 2023-07-13 ENCOUNTER — Encounter: Payer: Self-pay | Admitting: Internal Medicine

## 2023-07-13 NOTE — Assessment & Plan Note (Signed)
Lab Results  Component Value Date   VITAMINB12 451 07/10/2023   Stable, cont oral replacement - b12 1000 mcg qd

## 2023-07-13 NOTE — Assessment & Plan Note (Signed)
Lab Results  Component Value Date   LDLCALC 61 07/10/2023   Stable, pt to continue current statin zetia 10 every day, repatha 140 mg

## 2023-07-13 NOTE — Assessment & Plan Note (Signed)
Last vitamin D Lab Results  Component Value Date   VD25OH 24.40 (L) 07/10/2023   Low, to start oral replacement

## 2023-07-13 NOTE — Assessment & Plan Note (Addendum)
BP Readings from Last 3 Encounters:  07/10/23 124/72  06/13/23 108/68  04/20/23 132/84   Fair control here today, but pt to continue medical treatment benicar 40 every day, but for decreased toprol xl 50 every day, and cont f//u at home

## 2023-07-13 NOTE — Assessment & Plan Note (Signed)
No recent overt bleeding, for iron lab next draw

## 2023-07-13 NOTE — Assessment & Plan Note (Signed)
Lab Results  Component Value Date   CREATININE 1.22 (H) 07/10/2023   Stable overall, cont to avoid nephrotoxins

## 2023-07-13 NOTE — Assessment & Plan Note (Signed)
Age and sex appropriate education and counseling updated with regular exercise and diet Referrals for preventative services - declines dxa and colonoscopy Immunizations addressed - declines all Smoking counseling  - none needed Evidence for depression or other mood disorder - none significant Most recent labs reviewed. I have personally reviewed and have noted: 1) the patient's medical and social history 2) The patient's current medications and supplements 3) The patient's height, weight, and BMI have been recorded in the chart

## 2023-07-13 NOTE — Assessment & Plan Note (Signed)
Also for ua with labs

## 2023-07-13 NOTE — Assessment & Plan Note (Signed)
Lab Results  Component Value Date   TSH 4.78 07/10/2023   Stable, pt to continue levothyroxine 50 qd

## 2023-07-18 LAB — PAT ID TIQ DOC

## 2023-07-18 LAB — URINE CULTURE

## 2023-08-09 NOTE — Progress Notes (Signed)
Cardiology Office Note:  .   Date:  08/16/2023  ID:  Jennifer Andrade, DOB Jan 06, 1951, MRN [id number] PCP: Corwin Levins, MD   [facility] Providers Cardiologist:  Chrystie Nose, MD    Patient Profile: .      PMH Dyslipidemia Likely familial hyperlipidemia Family history heart disease Father had 3 MIs and CABG starting in his 32s Two half-sisters have coronary disease Mild to moderate bilateral carotid artery disease Statin intolerance Hypertension Hypothyroidism CKD Former tobacco abuse  Referred to advanced lipid clinic and seen by Dr. Rennis Golden 05/05/2020 by PCP for management of marked dyslipidemia.  Her lipid profile at that time showed total cholesterol 362, HDL 48, LDL 285, and triglycerides 147.  She reported longstanding history of elevated cholesterol despite eating very little unhealthy foods or high saturated fats.  Her BMI was 18.  Strong family history of heart disease including her father who had 3 MIs and CABG starting in his 80s.  Also 2 half sisters have coronary disease.  She has known ASCVD with mild to moderate bilateral carotid artery disease.  Unfortunately she cannot tolerate statins due to significant myalgias and mental status changes.  She was started on Praluent, however LDL remained above goal.  Dr. Rennis Golden recommended a trial of rosuvastatin 5 mg daily.  She unfortunately did not tolerate rosuvastatin, it made her feel like she had memory fog.  LDL did improve from 142-126.  Although she had 50% reduction, given her carotid artery burden goal was to reduce LDL even further.  She tolerated ezetimibe in the past but it was cost prohibitive at that time.   Last to lipid clinic visit was 11/09/2022 with Dr. Rennis Golden.  Her insurance required switch from Praluent to Repatha.  She had an EKG which showed sinus bradycardia.       History of Present Illness: Marland Kitchen   Jennifer Andrade is a very pleasant 72 y.o. female who is here today for follow-up of hyperlipidemia.  Most  recent lipid panel on 07/10/2023 revealed total cholesterol 153, triglycerides 107, HDL 69.9, and LDL 61. She reports having had UTIs since starting Repatha injections for her high cholesterol in January. The UTIs began in late January or early February and have recurred several times, with the most recent episode in August. The patient has been treated with antibiotics each time. She is unsure if the UTIs are related to the Repatha injections, but notes that UTIs are listed as a possible side effect of the medication. She was having dizziness and was advised to reduce her metoprolol dose. Since that time, the dizziness has resolved but blood pressure has increased. She reports a heart murmur since she was young. No prior echocardiogram to her awareness.  She reports getting "winded" with heavy mopping or vacuuming.  She does not do any regular exercise. She denies chest pain, orthopnea, PND, edema, presyncope, syncope.  ROS: See HPI       Studies Reviewed: .        Risk Assessment/Calculations:     HYPERTENSION CONTROL Vitals:   08/16/23 1338 08/16/23 1432  BP: (!) 156/80 (!) 150/80    The patient's blood pressure is elevated above target today.  In order to address the patient's elevated BP: A current anti-hypertensive medication was adjusted today.          Physical Exam:   VS:  BP (!) 150/80   Pulse 69   Ht 5\' 6"  (1.676 m)   Wt 106 lb 3.2 oz (  48.2 kg)   LMP 10/24/2001   SpO2 95%   BMI 17.14 kg/m    Wt Readings from Last 3 Encounters:  08/16/23 106 lb 3.2 oz (48.2 kg)  07/10/23 104 lb (47.2 kg)  06/13/23 104 lb 2 oz (47.2 kg)    GEN: Well nourished, well developed in no acute distress NECK: No JVD; No carotid bruits CARDIAC: RRR, 3/6 holosystolic murmur. No rubs, gallops RESPIRATORY:  Clear to auscultation without rales, wheezing or rhonchi  ABDOMEN: Soft, non-tender, non-distended EXTREMITIES:  No edema; No deformity     ASSESSMENT AND PLAN: .    Dyslipidemia LDL goal  < 70: NMR from 08/14/2023 revealed LDL particle number 936, LDL-C 79, HDL-C 64, triglycerides 136, total cholesterol 166, small LDL particle number 548.  She has had an increase in UTIs since starting Repatha, however she is very pleased with the improvement in lipids and would like to continue. We discussed that bempedoic acid could be an option for the future if she does not tolerate PCSK9i. Lengthy discussion about heart healthy diet.  She tends to eat small portions unless it is something she really likes. Likes fruits and vegetables but also eats red meat and starches such as potatoes and macaroni and cheese frequently.  Encouraged whole food diet as much as possible. Continue ezetimibe and Repatha.  Sinus bradycardia: History of sinus bradycardia noted on EKG 10/2022 with HR 55 bpm. HR 69 today.  She was having dizziness which resolved with reducing metoprolol from 100 mg to 50 mg daily. I suspect HR has slightly increased since she reduced metoprolol.  No concerns presently.  Murmur: Holosystolic murmur auscultated on exam today.  She reports having murmur since she was younger, but no record of a diagnosis. She is having some shortness of breath with moderate activity.  We will get echocardiogram to evaluate for structural heart disease. She would like to return for in-person visit after tests to discuss results in person.   Hypertension: BP is elevated today. Recently decreased metoprolol 2/2 dizziness. We discussed potential treatment options. She would like to limit pill burden as much as possible. She will try increased dose of amlodipine to 10 mg daily, split into 5 mg twice daily.  Advised her to notify us if she does not tolerate this dose or develops leg swelling. Home BP monitoring encouraged for goal BP < 130/80.  Continue metoprolol 50 mg daily and olmesartan 40 mg daily.  I will see her back soon for follow-up of BP.   Carotid artery disease/Subclavian steal syndrome: Carotid duplex  07/08/22 revealed 1-39% bilateral ICA stenosis, stenotic left subclavian artery, and retrograde flow of bilateral vertebral arteries.  She was referred to [facility] and seen by Dr. Lenell Antu 07/19/22. Medical management recommended.  We will repeat carotid duplex for surveillance.       Dispo: 2-3 months with me  Signed, Eligha Bridegroom, NP-C

## 2023-08-15 LAB — NMR, LIPOPROFILE
Cholesterol, Total: 166 mg/dL (ref 100–199)
HDL Particle Number: 42.9 umol/L (ref >=30.5)
HDL-C: 64 mg/dL (ref >39)
LDL Particle Number: 936 nmol/L (ref <1000)
LDL Size: 20.7 nmol (ref >20.5)
LDL-C (NIH Calc): 79 mg/dL (ref 0–99)
LP-IR Score: 46 — ABNORMAL HIGH (ref <=45)
Small LDL Particle Number: 548 nmol/L — ABNORMAL HIGH (ref <=527)
Triglycerides: 136 mg/dL (ref 0–149)

## 2023-08-16 ENCOUNTER — Encounter (HOSPITAL_BASED_OUTPATIENT_CLINIC_OR_DEPARTMENT_OTHER): Payer: Self-pay | Admitting: Nurse Practitioner

## 2023-08-16 ENCOUNTER — Ambulatory Visit (HOSPITAL_BASED_OUTPATIENT_CLINIC_OR_DEPARTMENT_OTHER): Payer: Medicare HMO | Admitting: Nurse Practitioner

## 2023-08-16 VITALS — BP 150/80 | HR 69 | Ht 66.0 in | Wt 106.2 lb

## 2023-08-16 DIAGNOSIS — E7849 Other hyperlipidemia: Secondary | ICD-10-CM | POA: Diagnosis not present

## 2023-08-16 DIAGNOSIS — I10 Essential (primary) hypertension: Secondary | ICD-10-CM | POA: Diagnosis not present

## 2023-08-16 DIAGNOSIS — T466X5A Adverse effect of antihyperlipidemic and antiarteriosclerotic drugs, initial encounter: Secondary | ICD-10-CM | POA: Diagnosis not present

## 2023-08-16 DIAGNOSIS — R011 Cardiac murmur, unspecified: Secondary | ICD-10-CM | POA: Diagnosis not present

## 2023-08-16 DIAGNOSIS — G458 Other transient cerebral ischemic attacks and related syndromes: Secondary | ICD-10-CM

## 2023-08-16 DIAGNOSIS — M791 Myalgia, unspecified site: Secondary | ICD-10-CM

## 2023-08-16 DIAGNOSIS — R001 Bradycardia, unspecified: Secondary | ICD-10-CM | POA: Diagnosis not present

## 2023-08-16 DIAGNOSIS — E785 Hyperlipidemia, unspecified: Secondary | ICD-10-CM

## 2023-08-16 DIAGNOSIS — I6523 Occlusion and stenosis of bilateral carotid arteries: Secondary | ICD-10-CM

## 2023-08-16 MED ORDER — AMLODIPINE BESYLATE 5 MG PO TABS: 5.0000 mg | ORAL_TABLET | Freq: Two times a day (BID) | ORAL | 3 refills | Status: DC

## 2023-08-16 NOTE — Patient Instructions (Signed)
Medication Instructions:   INCREASE Amlodipine one (1) tablet by mouth ( 5 mg) twice daily.   *If you need a refill on your cardiac medications before your next appointment, please call your pharmacy*   Lab Work:  None ordered.  If you have labs (blood work) drawn today and your tests are completely normal, you will receive your results only by: MyChart Message (if you have MyChart) OR A paper copy in the mail If you have any lab test that is abnormal or we need to change your treatment, we will call you to review the results.   Testing/Procedures:  Your physician has requested that you have a carotid duplex. This test is an ultrasound of the carotid arteries in your neck. It looks at blood flow through these arteries that supply the brain with blood. Allow one hour for this exam. There are no restrictions or special instructions.  Your physician has requested that you have an echocardiogram. Echocardiography is a painless test that uses sound waves to create images of your heart. It provides your doctor with information about the size and shape of your heart and how well your heart's chambers and valves are working. This procedure takes approximately one hour. There are no restrictions for this procedure. Please do NOT wear cologne, perfume or lotions (deodorant is allowed). Please arrive 15 minutes prior to your appointment time.    Follow-Up: At [facility], you and your health needs are our priority.  As part of our continuing mission to provide you with exceptional heart care, we have created designated Provider Care Teams.  These Care Teams include your primary Cardiologist (physician) and Advanced Practice Providers (APPs -  Physician Assistants and Nurse Practitioners) who all work together to provide you with the care you need, when you need it.  We recommend signing up for the patient portal called "MyChart".  Sign up information is provided on this After Visit  Summary.  MyChart is used to connect with patients for Virtual Visits (Telemedicine).  Patients are able to view lab/test results, encounter notes, upcoming appointments, etc.  Non-urgent messages can be sent to your provider as well.   To learn more about what you can do with MyChart, go to [email or web address].    Your next appointment:   2 month(s)  Provider:   Eligha Bridegroom, NP    Other Instructions  HOW TO TAKE YOUR BLOOD PRESSURE  Rest 5 minutes before taking your blood pressure. Don't  smoke or drink caffeinated beverages for at least 30 minutes before. Take your blood pressure before (not after) you eat. Sit comfortably with your back supported and both feet on the floor ( don't cross your legs). Elevate your arm to heart level on a table or a desk. Use the proper sized cuff.  It should fit smoothly and snugly around your bare upper arm.  There should be  Enough room to slip a fingertip under the cuff.  The bottom edge of the cuff should be 1 inch above the crease Of the elbow. Please monitor your blood pressure once daily 2 hours after your am medication. If you blood pressure Consistently remains above 130 (systolic) top number or over 80 ( diastolic) bottom number X 3 days  Consecutively.  Please call our office at [phone] or send Mychart message.     ----Avoid cold medicines with D or DM at the end of them----

## 2023-08-26 IMAGING — MG MM DIGITAL SCREENING BILAT W/ TOMO AND CAD
6 of 10 series · 6 of 30 positions shown · non-contrast
Comparison: None.

CLINICAL DATA: Screening.

EXAM:
DIGITAL SCREENING BILATERAL MAMMOGRAM WITH TOMOSYNTHESIS AND CAD
TECHNIQUE: Bilateral screening digital craniocaudal and mediolateral oblique
mammograms were obtained. Bilateral screening digital breast
tomosynthesis was performed. The images were evaluated with
computer-aided detection.

[R CC synth-2D]
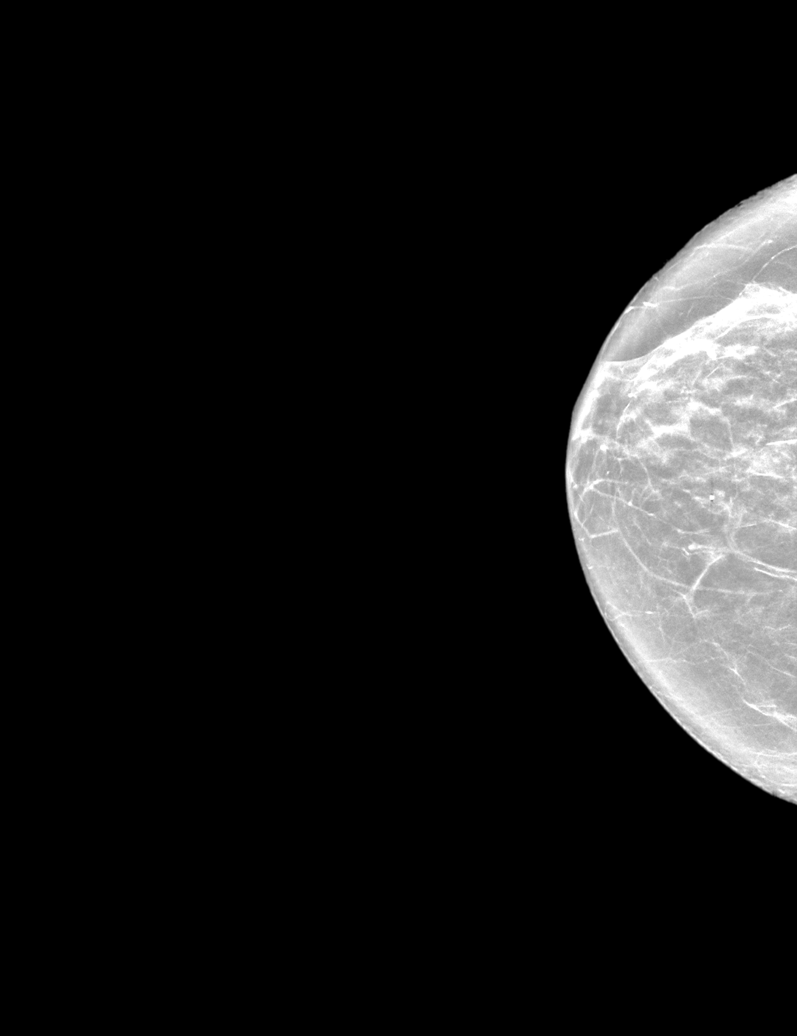

[L MLO synth-2D]
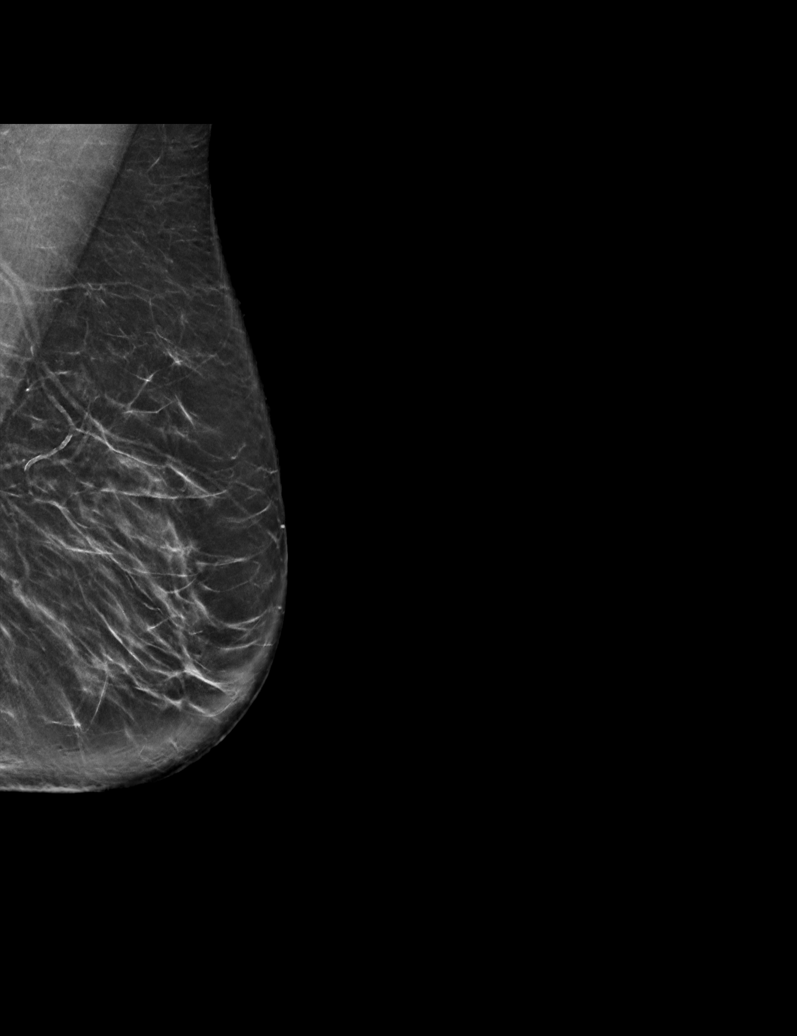

[L CC synth-2D (1 of 2)]
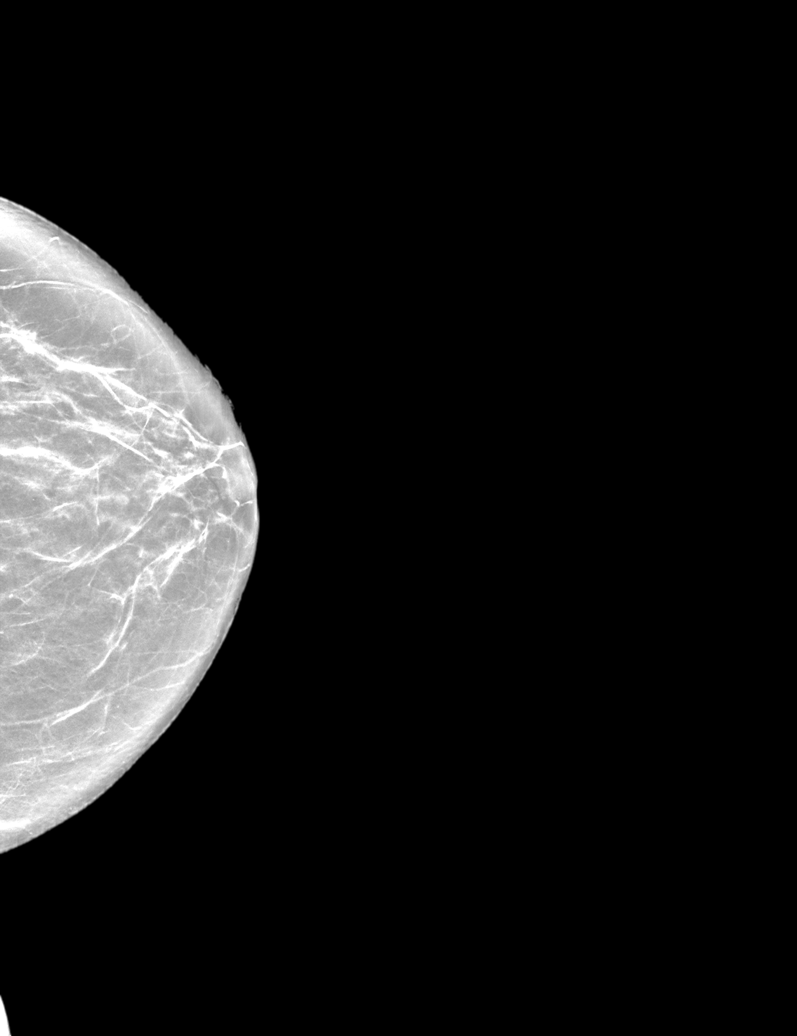

[R MLO synth-2D]
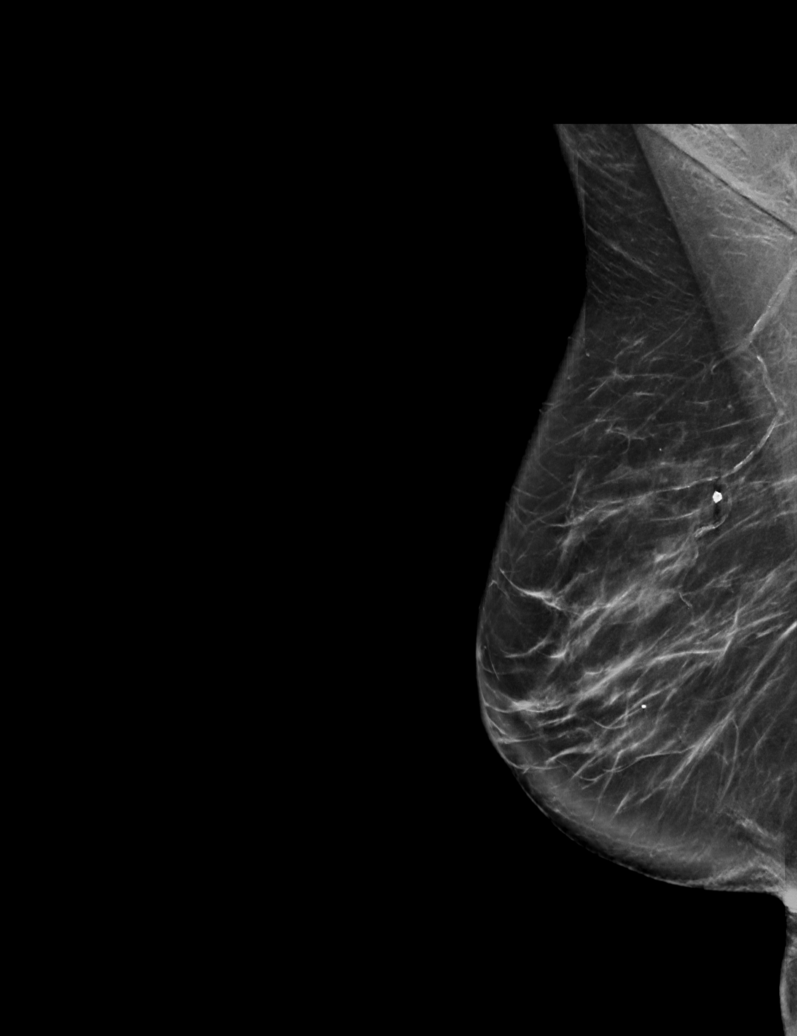

[L CC synth-2D (2 of 2)]
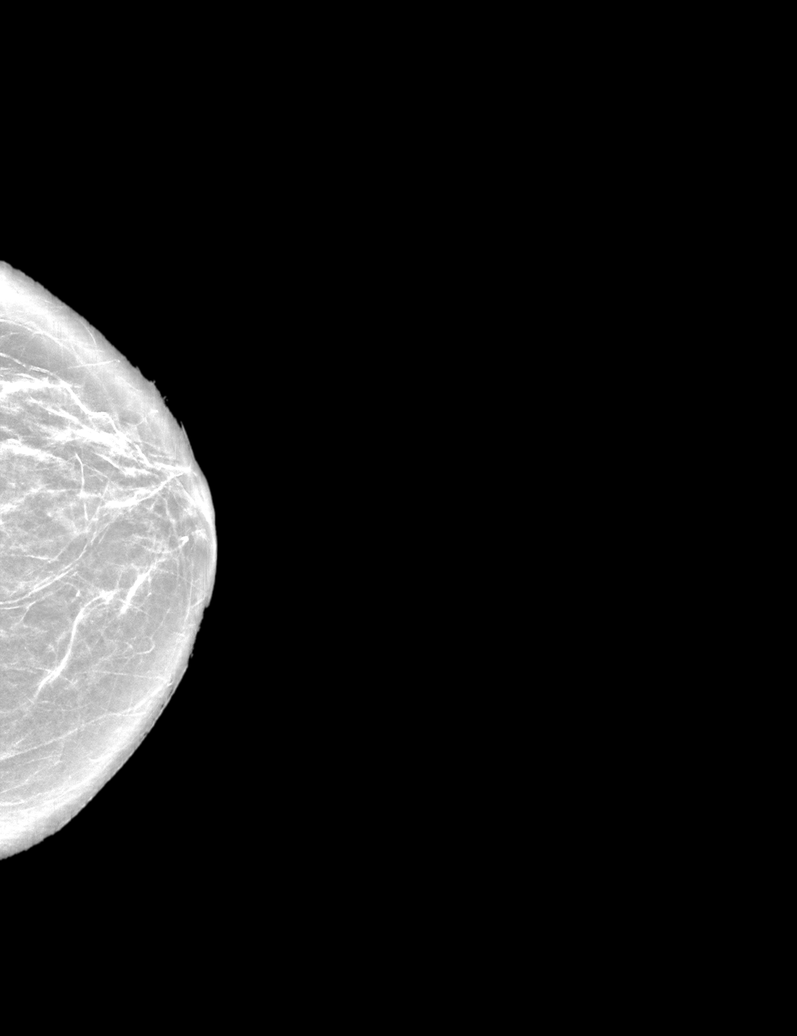

[L CC tomo · tomo slice 30/59.0]
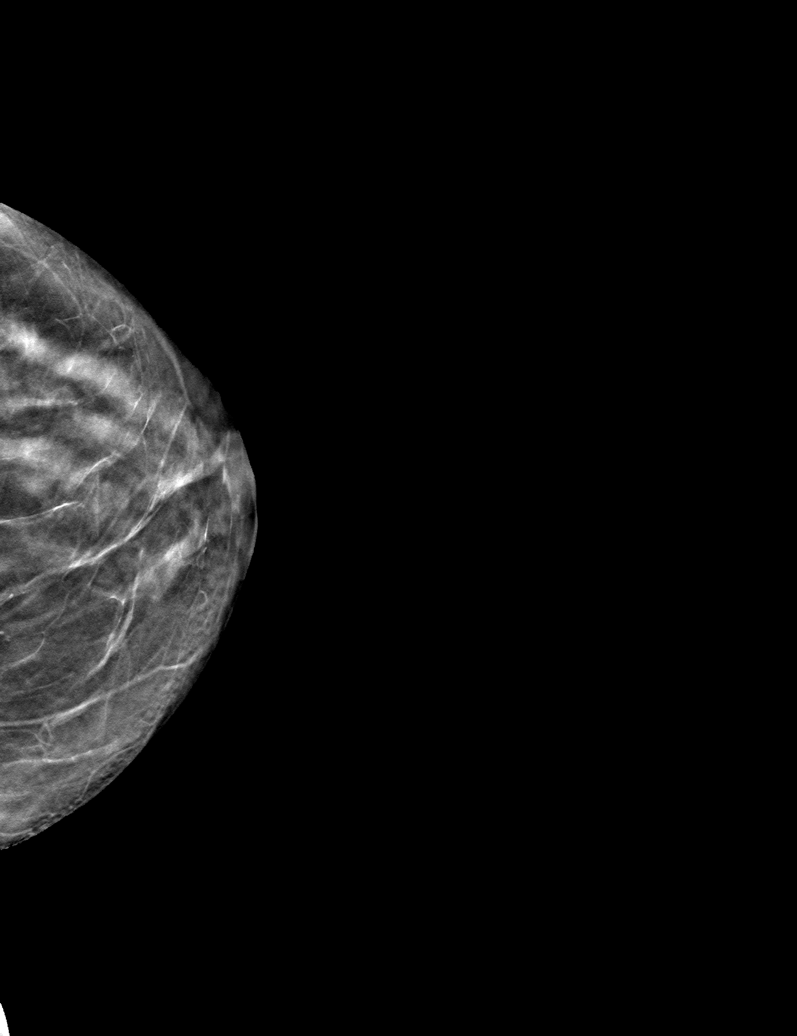

[6 of 30 positions shown; findings below may reference images not displayed]

ACR Breast Density Category b: There are scattered areas of
fibroglandular density.
FINDINGS: There are no findings suspicious for malignancy.
IMPRESSION: No mammographic evidence of malignancy. A result letter of this
screening mammogram will be mailed directly to the patient.

RECOMMENDATION:
Screening mammogram in one year. (Code:[id number]-[id number])

BI-RADS CATEGORY  1: Negative.

## 2023-09-07 ENCOUNTER — Ambulatory Visit (HOSPITAL_COMMUNITY)
Admission: RE | Admit: 2023-09-07 | Discharge: 2023-09-07 | Disposition: A | Discharge status: Home / Self Care | Payer: Medicare HMO | Source: Ambulatory Visit | Attending: Cardiology | Admitting: Cardiology

## 2023-09-07 DIAGNOSIS — E7849 Other hyperlipidemia: Secondary | ICD-10-CM | POA: Diagnosis not present

## 2023-09-07 DIAGNOSIS — I10 Essential (primary) hypertension: Secondary | ICD-10-CM | POA: Insufficient documentation

## 2023-09-07 DIAGNOSIS — I6523 Occlusion and stenosis of bilateral carotid arteries: Secondary | ICD-10-CM | POA: Diagnosis not present

## 2023-09-07 DIAGNOSIS — R011 Cardiac murmur, unspecified: Secondary | ICD-10-CM | POA: Diagnosis not present

## 2023-09-13 ENCOUNTER — Ambulatory Visit (HOSPITAL_BASED_OUTPATIENT_CLINIC_OR_DEPARTMENT_OTHER): Payer: Medicare HMO

## 2023-09-13 DIAGNOSIS — E7849 Other hyperlipidemia: Secondary | ICD-10-CM | POA: Diagnosis not present

## 2023-09-13 DIAGNOSIS — R011 Cardiac murmur, unspecified: Secondary | ICD-10-CM | POA: Diagnosis not present

## 2023-09-13 DIAGNOSIS — I10 Essential (primary) hypertension: Secondary | ICD-10-CM | POA: Diagnosis not present

## 2023-09-13 DIAGNOSIS — I6523 Occlusion and stenosis of bilateral carotid arteries: Secondary | ICD-10-CM

## 2023-09-13 LAB — ECHOCARDIOGRAM COMPLETE
AV Vena cont: 0.3 cm
Area-P 1/2: 2.99 cm2
MV M vel: 6.34 m/s
MV Peak grad: 160.8 mm[Hg]
P 1/2 time: 1024 ms
Radius: 0.5 cm
S' Lateral: 2.62 cm

## 2023-09-26 NOTE — Progress Notes (Unsigned)
Cardiology Office Note:  .   Date:  09/27/2023  ID:  Jennifer Andrade, DOB 22-Apr-1951, MRN [id number] PCP: Corwin Levins, MD  [facility] Providers Cardiologist:  Chrystie Nose, MD    Patient Profile: .      PMH Dyslipidemia Likely familial hyperlipidemia Family history heart disease Father had 3 MIs and CABG starting in his 9s Two half-sisters have coronary disease Mild to moderate bilateral carotid artery disease Statin intolerance Hypertension Hypothyroidism CKD Former tobacco abuse  Referred to advanced lipid clinic and seen by Dr. Rennis Golden 05/05/2020 by PCP for management of marked dyslipidemia.  Her lipid profile at that time showed total cholesterol 362, HDL 48, LDL 285, and triglycerides 147.  She reported longstanding history of elevated cholesterol despite eating very little unhealthy foods or high saturated fats.  Her BMI was 18.  Strong family history of heart disease including her father who had 3 MIs and CABG starting in his 20s.  Also 2 half sisters have coronary disease.  She has known ASCVD with mild to moderate bilateral carotid artery disease.  Unfortunately she cannot tolerate statins due to significant myalgias and mental status changes.  She was started on Praluent, however LDL remained above goal.  Dr. Rennis Golden recommended a trial of rosuvastatin 5 mg daily.  She unfortunately did not tolerate rosuvastatin, it made her feel like she had memory fog.  LDL did improve from 142-126.  Although she had 50% reduction, given her carotid artery burden goal was to reduce LDL even further.  She tolerated ezetimibe in the past but it was cost prohibitive at that time.   Last to lipid clinic visit was 11/09/2022 with Dr. Rennis Golden.  Her insurance required switch from Praluent to Repatha.  She had an EKG which showed sinus bradycardia.  Seen by me on 08/16/23 for follow-up of hyperlipidemia. Most recent lipid panel on 07/10/2023 revealed total cholesterol 153, triglycerides 107, HDL  69.9, and LDL 61. Reported having UTIs since starting Repatha injections in January. The UTIs began in late January or early February and have recurred several times, with the most recent episode in August. She has been treated with antibiotics each time. Is unsure if the UTIs are related to the Repatha injections, but notes that UTIs are listed as a possible side effect of the medication. She reported dizziness and was advised to reduce her metoprolol dose. Since that time, the dizziness has resolved but blood pressure has increased. Reports history of heart murmur since she was young. No prior echocardiogram to her awareness. Reported getting "winded" with heavy mopping or vacuuming, no regular exercise. She denied chest pain, orthopnea, PND, edema, presyncope, syncope.   Due to shortness of breath, we ordered echo on 09/13/2023 which revealed normal LVEF 60 to 65%, no RWMA, G1 DD, mildly reduced RV systolic function, mild to moderate MR, mild AI.        History of Present Illness: Marland Kitchen   Jennifer Andrade is a very pleasant 72 y.o. female who is here today for follow-up of hypertension.  She reported increasing her metoprolol to 100 mg daily soon after our last visit.  Reports no further episodes of dizziness since our last visit.  I prescribed amlodipine 5 mg twice daily, however she has been taking it only in the mornings.  She reports she is feeling well with no symptoms associated with high blood pressure readings.  She has not been monitoring home BP consistently.  No symptoms of urinary tract infection recently.  We reviewed her echo results in detail.  She denies chest pain, orthopnea, PND, edema, shortness of breath, presyncope, syncope.   ROS: See HPI       Studies Reviewed: .        Risk Assessment/Calculations:     HYPERTENSION CONTROL Vitals:   09/27/23 1512 09/27/23 1552  BP: (!) 185/84 (!) 180/80    The patient's blood pressure is elevated above target today.  In order to address  the patient's elevated BP: Blood pressure will be monitored at home to determine if medication changes need to be made.          Physical Exam:   VS:  BP (!) 180/80   Pulse 62   Ht 5\' 6"  (1.676 m)   Wt 108 lb (49 kg)   LMP 10/24/2001   SpO2 91%   BMI 17.43 kg/m    Wt Readings from Last 3 Encounters:  09/27/23 108 lb (49 kg)  08/16/23 106 lb 3.2 oz (48.2 kg)  07/10/23 104 lb (47.2 kg)    GEN: Well nourished, well developed in no acute distress NECK: No JVD; No carotid bruits CARDIAC: RRR, 3/6 holosystolic murmur. No rubs, gallops RESPIRATORY:  Clear to auscultation without rales, wheezing or rhonchi  ABDOMEN: Soft, non-tender, non-distended EXTREMITIES:  No edema; No deformity     ASSESSMENT AND PLAN: .    Hypertension: BP is elevated today and remains elevated on my recheck. She does not monitor BP consistently. Since our last visit, she resumed metoprolol 100 mg daily. She has been taking amlodipine once daily rather than twice daily as prescribed and olmesartan 40 mg daily. I have asked her to monitor on a consistent basis at home and increase amlodipine to 5 mg twice daily if SBP consistently > 140 mmHg.  Dyslipidemia LDL goal < 70: NMR from 08/14/2023 revealed LDL particle number 936, LDL-C 79, HDL-C 64, triglycerides 136, total cholesterol 166, small LDL particle number [id number]. Recurrent UTIs soon after starting Repatha, however she elected to continue due to significant improvement in cholesterol.  Encouraged whole food diet as much as possible. Continue ezetimibe and Repatha.  Valve disease: Mild to moderate MR, mild AI on TTE 09/13/23 with normal LVEF. She is asymptomatic. History of valve problems in her mother and aunt with no prior surgeries. We will continue to follow clinically for now.   Carotid artery disease/Subclavian steal syndrome: Previously referred to [facility] and seen by Dr. Lenell Antu 06/2022 with recommendation for medical management. Carotid duplex completed 09/07/23  revealed stable mild stenosis 1-39% in bilateral carotids and known subclavian stenosis. Plan to repeat imaging in 1 year.         Dispo: 6 months with me  Signed, Eligha Bridegroom, NP-C

## 2023-09-27 ENCOUNTER — Encounter (HOSPITAL_BASED_OUTPATIENT_CLINIC_OR_DEPARTMENT_OTHER): Payer: Self-pay | Admitting: Nurse Practitioner

## 2023-09-27 ENCOUNTER — Ambulatory Visit (HOSPITAL_BASED_OUTPATIENT_CLINIC_OR_DEPARTMENT_OTHER): Payer: Medicare HMO | Admitting: Nurse Practitioner

## 2023-09-27 VITALS — BP 180/80 | HR 62 | Ht 66.0 in | Wt 108.0 lb

## 2023-09-27 DIAGNOSIS — I340 Nonrheumatic mitral (valve) insufficiency: Secondary | ICD-10-CM

## 2023-09-27 DIAGNOSIS — I351 Nonrheumatic aortic (valve) insufficiency: Secondary | ICD-10-CM

## 2023-09-27 DIAGNOSIS — E785 Hyperlipidemia, unspecified: Secondary | ICD-10-CM | POA: Diagnosis not present

## 2023-09-27 DIAGNOSIS — I10 Essential (primary) hypertension: Secondary | ICD-10-CM | POA: Diagnosis not present

## 2023-09-27 DIAGNOSIS — I6523 Occlusion and stenosis of bilateral carotid arteries: Secondary | ICD-10-CM | POA: Diagnosis not present

## 2023-09-27 DIAGNOSIS — G458 Other transient cerebral ischemic attacks and related syndromes: Secondary | ICD-10-CM | POA: Diagnosis not present

## 2023-09-27 NOTE — Patient Instructions (Signed)
Medication Instructions:    Your physician recommends that you continue on your current medications as directed. Please refer to the Current Medication list given to you today.   Please Take an additional Amlodipine ( 5 mg) if you systolic consistently X 3 days is over 140.    *If you need a refill on your cardiac medications before your next appointment, please call your pharmacy*   Lab Work:  None ordered.  If you have labs (blood work) drawn today and your tests are completely normal, you will receive your results only by: MyChart Message (if you have MyChart) OR A paper copy in the mail If you have any lab test that is abnormal or we need to change your treatment, we will call you to review the results.   Testing/Procedures:  None ordered.   Follow-Up: At [facility], you and your health needs are our priority.  As part of our continuing mission to provide you with exceptional heart care, we have created designated Provider Care Teams.  These Care Teams include your primary Cardiologist (physician) and Advanced Practice Providers (APPs -  Physician Assistants and Nurse Practitioners) who all work together to provide you with the care you need, when you need it.  We recommend signing up for the patient portal called "MyChart".  Sign up information is provided on this After Visit Summary.  MyChart is used to connect with patients for Virtual Visits (Telemedicine).  Patients are able to view lab/test results, encounter notes, upcoming appointments, etc.  Non-urgent messages can be sent to your provider as well.   To learn more about what you can do with MyChart, go to [email or web address].    Your next appointment:   6 month(s)  Provider:   Eligha Bridegroom, NP    Other Instructions  HOW TO TAKE YOUR BLOOD PRESSURE  Rest 5 minutes before taking your blood pressure. Don't  smoke or drink caffeinated beverages for at least 30 minutes before. Take your blood  pressure before (not after) you eat. Sit comfortably with your back supported and both feet on the floor ( don't cross your legs). Elevate your arm to heart level on a table or a desk. Use the proper sized cuff.  It should fit smoothly and snugly around your bare upper arm.  There should be  Enough room to slip a fingertip under the cuff.  The bottom edge of the cuff should be 1 inch above the crease Of the elbow. Please monitor your blood pressure once daily 2 hours after your am medication. If you blood pressure Consistently remains above 140 (systolic) top number or over 80 ( diastolic) bottom number X 3 days  Consecutively.  Please call our office at [phone] or send Mychart message.     ----Avoid cold medicines with D or DM at the end of them----

## 2023-09-28 ENCOUNTER — Other Ambulatory Visit: Payer: Self-pay | Admitting: Internal Medicine

## 2023-12-13 ENCOUNTER — Telehealth: Payer: Self-pay | Admitting: Pharmacist Clinician (PhC)/ Clinical Pharmacy Specialist

## 2023-12-13 NOTE — Telephone Encounter (Signed)
 Received fax that [employer] denied tier exception for Repatha.  Offered patient Orthoptist.    Jennifer Andrade received, information sent to patient via MyChart and mail.

## 2024-01-09 ENCOUNTER — Ambulatory Visit: Payer: Medicare HMO | Admitting: Internal Medicine

## 2024-01-11 ENCOUNTER — Encounter: Payer: Self-pay | Admitting: Internal Medicine

## 2024-01-11 ENCOUNTER — Ambulatory Visit (INDEPENDENT_AMBULATORY_CARE_PROVIDER_SITE_OTHER): Admitting: Internal Medicine

## 2024-01-11 VITALS — BP 122/80 | HR 60 | Temp 97.7°F | Ht 66.0 in | Wt 106.0 lb

## 2024-01-11 DIAGNOSIS — J309 Allergic rhinitis, unspecified: Secondary | ICD-10-CM

## 2024-01-11 DIAGNOSIS — Z0001 Encounter for general adult medical examination with abnormal findings: Secondary | ICD-10-CM

## 2024-01-11 DIAGNOSIS — E875 Hyperkalemia: Secondary | ICD-10-CM | POA: Insufficient documentation

## 2024-01-11 DIAGNOSIS — I10 Essential (primary) hypertension: Secondary | ICD-10-CM | POA: Diagnosis not present

## 2024-01-11 DIAGNOSIS — E538 Deficiency of other specified B group vitamins: Secondary | ICD-10-CM | POA: Diagnosis not present

## 2024-01-11 DIAGNOSIS — E559 Vitamin D deficiency, unspecified: Secondary | ICD-10-CM | POA: Diagnosis not present

## 2024-01-11 DIAGNOSIS — R739 Hyperglycemia, unspecified: Secondary | ICD-10-CM

## 2024-01-11 DIAGNOSIS — N1831 Chronic kidney disease, stage 3a: Secondary | ICD-10-CM | POA: Diagnosis not present

## 2024-01-11 DIAGNOSIS — E7800 Pure hypercholesterolemia, unspecified: Secondary | ICD-10-CM | POA: Diagnosis not present

## 2024-01-11 NOTE — Patient Instructions (Signed)
 We can hold on the bone density testing for now  Please continue all other medications as before  Please have the pharmacy call with any other refills you may need.  Please continue your efforts at being more active, low cholesterol diet, and weight control.  You are otherwise up to date with prevention measures today.  Please keep your appointments with your specialists as you may have planned  Please go to the LAB at the blood drawing area for the tests to be done - maybe tomorrow as you mentioned  You will be contacted by phone if any changes need to be made immediately.  Otherwise, you will receive a letter about your results with an explanation, but please check with MyChart first.  Please make an Appointment to return in 6 months, or sooner if needed

## 2024-01-11 NOTE — Progress Notes (Signed)
 Patient ID: Jennifer Andrade, female   DOB: 1951-06-27, 73 y.o.   MRN: [id number]        Chief Complaint: follow up HTN, hld, low vit d, ckd3a, low b12       HPI:  Jennifer Andrade is a 73 y.o. female here overall doing ok, Pt denies chest pain, increased sob or doe, wheezing, orthopnea, PND, increased LE swelling, palpitations, dizziness or syncope.   Pt denies polydipsia, polyuria, or new focal neuro s/s.    Pt denies fever, wt loss, night sweats, loss of appetite, or other constitutional symptoms  Declines dxa for now.  Does have several wks ongoing nasal allergy symptoms with clearish congestion, itch and sneezing, without fever, pain, ST, cough, swelling or wheezing.  Wt Readings from Last 3 Encounters:  01/11/24 106 lb (48.1 kg)  09/27/23 108 lb (49 kg)  08/16/23 106 lb 3.2 oz (48.2 kg)   BP Readings from Last 3 Encounters:  01/11/24 122/80  09/27/23 (!) 180/80  08/16/23 (!) 150/80         Past Medical History:  Diagnosis Date   ANEMIA-IRON DEFICIENCY 04/15/2008   Cataract    CKD (chronic kidney disease) stage 3, GFR 30-59 ml/min (HCC) 03/21/2019   Heart murmur    as a child   HYPERLIPIDEMIA 04/15/2008   HYPERTENSION 04/15/2008   HYPOTHYROIDISM 04/15/2008   LUMBAR RADICULOPATHY, LEFT 11/24/2009   Migraine 03/25/2011   OSTEOPENIA 04/15/2008   Other specified forms of hearing loss 09/30/2010   Phlebitis 1974   when pregnant   SINUSITIS- ACUTE-NOS 09/30/2010   Vitamin D deficiency 10/2011   level 16   Past Surgical History:  Procedure Laterality Date   BACK SURGERY     cataract surg     lens replacement Bil   tooth removal  2002   TUBAL LIGATION      reports that she quit smoking about 7 years ago. Her smoking use included cigarettes. She started smoking about 54 years ago. She has a 47 pack-year smoking history. She has never used smokeless tobacco. She reports current alcohol use of about 1.0 standard drink of alcohol per week. She reports that she does not use drugs. family  history includes Colon cancer in her mother; Heart disease in her father; Hypertension in her father; Kidney disease in her father; Thyroid disease in her mother. Allergies  Allergen Reactions   Atorvastatin     REACTION: myalgias   Rosuvastatin     REACTION: memory problem   Shellfish-Derived Products Other (See Comments)   Sulfa Antibiotics Other (See Comments)    Pt fearful to take after daughter with Trudie Buckler   Current Outpatient Medications on File Prior to Visit  Medication Sig Dispense Refill   amLODipine (NORVASC) 5 MG tablet Take 1 tablet (5 mg total) by mouth in the morning and at bedtime. 180 tablet 3   Cholecalciferol 50 MCG (2000 UT) TABS 1 ta by mouth once daily 30 tablet 99   Evolocumab (REPATHA SURECLICK) 140 MG/ML SOAJ INJECT 140 MG INTO THE SKIN EVERY 14 (FOURTEEN) DAYS. 6 mL 3   ezetimibe (ZETIA) 10 MG tablet Take 1 tablet (10 mg total) by mouth daily. 90 tablet 3   hydrocortisone (ANUSOL-HC) 2.5 % rectal cream PLACE 1 APPLICATION RECTALLY 2 (TWO) TIMES DAILY. 30 g 0   levothyroxine (SYNTHROID) 50 MCG tablet Take 1 tablet (50 mcg total) by mouth daily. 90 tablet 3   metoprolol succinate (TOPROL-XL) 100 MG 24 hr tablet Take 0.5 tablets (  50 mg total) by mouth daily. TAKE WITH OR IMMEDIATELY FOLLOWING A MEAL. (Patient taking differently: Take 100 mg by mouth daily. TAKE WITH OR IMMEDIATELY FOLLOWING A MEAL.) 45 tablet 3   olmesartan (BENICAR) 40 MG tablet Take 1 tablet (40 mg total) by mouth daily. 90 tablet 3   No current facility-administered medications on file prior to visit.        ROS:  All others reviewed and negative.  Objective        PE:  BP 122/80 (BP Location: Right Arm, Patient Position: Sitting, Cuff Size: Normal)   Pulse 60   Temp 97.7 F (36.5 C) (Oral)   Ht 5\' 6"  (1.676 m)   Wt 106 lb (48.1 kg)   LMP 10/24/2001   SpO2 94%   BMI 17.11 kg/m                 Constitutional: Pt appears in NAD               HENT: Head: NCAT.                 Right Ear: External ear normal.                 Left Ear: External ear normal.                Eyes: . Pupils are equal, round, and reactive to light. Conjunctivae and EOM are normal               Nose: without d/c or deformity               Neck: Neck supple. Gross normal ROM               Cardiovascular: Normal rate and regular rhythm.                 Pulmonary/Chest: Effort normal and breath sounds without rales or wheezing.                Abd:  Soft, NT, ND, + BS, no organomegaly               Neurological: Pt is alert. At baseline orientation, motor grossly intact               Skin: Skin is warm. No rashes, no other new lesions, LE edema - none               Psychiatric: Pt behavior is normal without agitation   Micro: none  Cardiac tracings I have personally interpreted today:  none  Pertinent Radiological findings (summarize): none   Lab Results  Component Value Date   WBC 6.8 07/10/2023   HGB 14.0 07/10/2023   HCT 43.1 07/10/2023   PLT 216.0 07/10/2023   GLUCOSE 101 (H) 07/10/2023   CHOL 153 07/10/2023   TRIG 107.0 07/10/2023   HDL 69.90 07/10/2023   LDLDIRECT 195.2 03/22/2011   LDLCALC 61 07/10/2023   ALT 13 07/10/2023   AST 20 07/10/2023   NA 139 07/10/2023   K 4.8 07/10/2023   CL 101 07/10/2023   CREATININE 1.22 (H) 07/10/2023   BUN 30 (H) 07/10/2023   CO2 28 07/10/2023   TSH 4.78 07/10/2023   HGBA1C 6.1 07/10/2023   Assessment/Plan:  Jennifer Andrade is a 73 y.o. White or Caucasian [1] female with  has a past medical history of ANEMIA-IRON DEFICIENCY (04/15/2008), Cataract, CKD (chronic kidney disease) stage 3, GFR 30-59 ml/min (HCC) (03/21/2019),  Heart murmur, HYPERLIPIDEMIA (04/15/2008), HYPERTENSION (04/15/2008), HYPOTHYROIDISM (04/15/2008), LUMBAR RADICULOPATHY, LEFT (11/24/2009), Migraine (03/25/2011), OSTEOPENIA (04/15/2008), Other specified forms of hearing loss (09/30/2010), Phlebitis (1974), SINUSITIS- ACUTE-NOS (09/30/2010), and Vitamin D deficiency  (10/2011).  Vitamin D deficiency Last vitamin D Lab Results  Component Value Date   VD25OH 24.40 (L) 07/10/2023   Low, to start oral replacement   Hyperlipidemia Lab Results  Component Value Date   LDLCALC 61 07/10/2023   Stable, pt to continue current statin repatha 140 mg, zetia 10 mg   Essential hypertension BP Readings from Last 3 Encounters:  01/11/24 122/80  09/27/23 (!) 180/80  08/16/23 (!) 150/80   Stable, pt to continue medical treatment norvasc 5 every day, toprol xl 100 every day, benicar 40 qd   CKD (chronic kidney disease) stage 3, GFR 30-59 ml/min (HCC) Lab Results  Component Value Date   CREATININE 1.22 (H) 07/10/2023   Stable overall, cont to avoid nephrotoxins   B12 deficiency Lab Results  Component Value Date   VITAMINB12 451 07/10/2023   Stable, cont oral replacement - b12 1000 mcg qd   Allergic rhinitis Mild to mod, for OTC allegra and or nasacort asd,,  to f/u any worsening symptoms or concerns Followup: Return in about 6 months (around 07/13/2024).  Oliver Barre, MD 01/13/2024 9:36 PM [facility]

## 2024-01-13 ENCOUNTER — Encounter: Payer: Self-pay | Admitting: Internal Medicine

## 2024-01-13 NOTE — Assessment & Plan Note (Signed)
 Mild to mod, for OTC allegra and or nasacort asd,,  to f/u any worsening symptoms or concerns

## 2024-01-13 NOTE — Assessment & Plan Note (Signed)
 BP Readings from Last 3 Encounters:  01/11/24 122/80  09/27/23 (!) 180/80  08/16/23 (!) 150/80   Stable, pt to continue medical treatment norvasc 5 every day, toprol xl 100 every day, benicar 40 qd

## 2024-01-13 NOTE — Assessment & Plan Note (Signed)
Lab Results  Component Value Date   VITAMINB12 451 07/10/2023   Stable, cont oral replacement - b12 1000 mcg qd

## 2024-01-13 NOTE — Assessment & Plan Note (Signed)
 Lab Results  Component Value Date   LDLCALC 61 07/10/2023   Stable, pt to continue current statin repatha 140 mg, zetia 10 mg

## 2024-01-13 NOTE — Assessment & Plan Note (Signed)
Lab Results  Component Value Date   CREATININE 1.22 (H) 07/10/2023   Stable overall, cont to avoid nephrotoxins

## 2024-01-13 NOTE — Assessment & Plan Note (Signed)
Last vitamin D Lab Results  Component Value Date   VD25OH 24.40 (L) 07/10/2023   Low, to start oral replacement

## 2024-04-24 ENCOUNTER — Ambulatory Visit (INDEPENDENT_AMBULATORY_CARE_PROVIDER_SITE_OTHER): Admitting: Internal Medicine

## 2024-04-24 VITALS — BP 148/78 | HR 71 | Temp 97.6°F | Ht 66.0 in | Wt 104.0 lb

## 2024-04-24 DIAGNOSIS — N1831 Chronic kidney disease, stage 3a: Secondary | ICD-10-CM

## 2024-04-24 DIAGNOSIS — I10 Essential (primary) hypertension: Secondary | ICD-10-CM | POA: Diagnosis not present

## 2024-04-24 DIAGNOSIS — E559 Vitamin D deficiency, unspecified: Secondary | ICD-10-CM

## 2024-04-24 DIAGNOSIS — N390 Urinary tract infection, site not specified: Secondary | ICD-10-CM | POA: Diagnosis not present

## 2024-04-24 DIAGNOSIS — T83511A Infection and inflammatory reaction due to indwelling urethral catheter, initial encounter: Secondary | ICD-10-CM | POA: Diagnosis not present

## 2024-04-24 MED ORDER — CIPROFLOXACIN HCL 500 MG PO TABS
500.0000 mg | ORAL_TABLET | Freq: Two times a day (BID) | ORAL | 0 refills | Status: AC
Start: 2024-04-24 — End: 2024-05-04

## 2024-04-24 NOTE — Assessment & Plan Note (Signed)
 BP Readings from Last 3 Encounters:  04/24/24 (!) 148/78  01/11/24 122/80  09/27/23 (!) 180/80   Uncontrolled today , pt states controlled at home, likely reactive today,  pt to continue medical treatment norvasc  5 every day, toprol  xl 100 every day, benicar  40 mg qd

## 2024-04-24 NOTE — Progress Notes (Signed)
 Patient ID: Jennifer Andrade, female   DOB: Sep 10, 1951, 73 y.o.   MRN: [id number]        Chief Complaint: follow up dysuria with frequency, htn, low vit d, ckd3a       HPI:  Jennifer Andrade is a 73 y.o. female here with above x 2 days, similar to prior UTI about 1 yr ago.  Denies urinary symptoms such as flank pain, hematuria or n/v, fever, chills.  Pt denies chest pain, increased sob or doe, wheezing, orthopnea, PND, increased LE swelling, palpitations, dizziness or syncope.   Pt denies polydipsia, polyuria, or new focal neuro s/s.          Wt Readings from Last 3 Encounters:  04/24/24 104 lb (47.2 kg)  01/11/24 106 lb (48.1 kg)  09/27/23 108 lb (49 kg)   BP Readings from Last 3 Encounters:  04/24/24 (!) 148/78  01/11/24 122/80  09/27/23 (!) 180/80         Past Medical History:  Diagnosis Date   ANEMIA-IRON DEFICIENCY 04/15/2008   Cataract    CKD (chronic kidney disease) stage 3, GFR 30-59 ml/min (HCC) 03/21/2019   Heart murmur    as a child   HYPERLIPIDEMIA 04/15/2008   HYPERTENSION 04/15/2008   HYPOTHYROIDISM 04/15/2008   LUMBAR RADICULOPATHY, LEFT 11/24/2009   Migraine 03/25/2011   OSTEOPENIA 04/15/2008   Other specified forms of hearing loss 09/30/2010   Phlebitis 1974   when pregnant   SINUSITIS- ACUTE-NOS 09/30/2010   Vitamin D  deficiency 10/2011   level 16   Past Surgical History:  Procedure Laterality Date   BACK SURGERY     cataract surg     lens replacement Bil   tooth removal  2002   TUBAL LIGATION      reports that she quit smoking about 7 years ago. Her smoking use included cigarettes. She started smoking about 54 years ago. She has a 47 pack-year smoking history. She has never used smokeless tobacco. She reports current alcohol use of about 1.0 standard drink of alcohol per week. She reports that she does not use drugs. family history includes Colon cancer in her mother; Heart disease in her father; Hypertension in her father; Kidney disease in her father; Thyroid   disease in her mother. Allergies  Allergen Reactions   Atorvastatin     REACTION: myalgias   Rosuvastatin      REACTION: memory problem   Shellfish-Derived Products Other (See Comments)   Sulfa  Antibiotics Other (See Comments)    Pt fearful to take after daughter with steven johnson   Current Outpatient Medications on File Prior to Visit  Medication Sig Dispense Refill   amLODipine  (NORVASC ) 5 MG tablet Take 1 tablet (5 mg total) by mouth in the morning and at bedtime. 180 tablet 3   Cholecalciferol  50 MCG (2000 UT) TABS 1 ta by mouth once daily 30 tablet 99   Evolocumab  (REPATHA  SURECLICK) 140 MG/ML SOAJ INJECT 140 MG INTO THE SKIN EVERY 14 (FOURTEEN) DAYS. 6 mL 3   ezetimibe  (ZETIA ) 10 MG tablet Take 1 tablet (10 mg total) by mouth daily. 90 tablet 3   hydrocortisone  (ANUSOL -HC) 2.5 % rectal cream PLACE 1 APPLICATION RECTALLY 2 (TWO) TIMES DAILY. 30 g 0   levothyroxine  (SYNTHROID ) 50 MCG tablet Take 1 tablet (50 mcg total) by mouth daily. 90 tablet 3   metoprolol  succinate (TOPROL -XL) 100 MG 24 hr tablet Take 0.5 tablets (50 mg total) by mouth daily. TAKE WITH OR IMMEDIATELY FOLLOWING A MEAL. (Patient taking  differently: Take 100 mg by mouth daily. TAKE WITH OR IMMEDIATELY FOLLOWING A MEAL.) 45 tablet 3   olmesartan  (BENICAR ) 40 MG tablet Take 1 tablet (40 mg total) by mouth daily. 90 tablet 3   No current facility-administered medications on file prior to visit.        ROS:  All others reviewed and negative.  Objective        PE:  BP (!) 148/78   Pulse 71   Temp 97.6 F (36.4 C) (Temporal)   Ht 5' 6 (1.676 m)   Wt 104 lb (47.2 kg)   LMP 10/24/2001   SpO2 94%   BMI 16.79 kg/m                 Constitutional: Pt appears in NAD               HENT: Head: NCAT.                Right Ear: External ear normal.                 Left Ear: External ear normal.                Eyes: . Pupils are equal, round, and reactive to light. Conjunctivae and EOM are normal               Nose:  without d/c or deformity               Neck: Neck supple. Gross normal ROM               Cardiovascular: Normal rate and regular rhythm.                 Pulmonary/Chest: Effort normal and breath sounds without rales or wheezing.                Abd:  Soft, NT, ND, + BS, no organomegaly, no flank tender               Neurological: Pt is alert. At baseline orientation, motor grossly intact               Skin: Skin is warm. No rashes, no other new lesions, LE edema - none               Psychiatric: Pt behavior is normal without agitation   Micro: none  Cardiac tracings I have personally interpreted today:  none  Pertinent Radiological findings (summarize): none   Lab Results  Component Value Date   WBC 6.8 07/10/2023   HGB 14.0 07/10/2023   HCT 43.1 07/10/2023   PLT 216.0 07/10/2023   GLUCOSE 101 (H) 07/10/2023   CHOL 153 07/10/2023   TRIG 107.0 07/10/2023   HDL 69.90 07/10/2023   LDLDIRECT 195.2 03/22/2011   LDLCALC 61 07/10/2023   ALT 13 07/10/2023   AST 20 07/10/2023   NA 139 07/10/2023   K 4.8 07/10/2023   CL 101 07/10/2023   CREATININE 1.22 (H) 07/10/2023   BUN 30 (H) 07/10/2023   CO2 28 07/10/2023   TSH 4.78 07/10/2023   HGBA1C 6.1 07/10/2023   Assessment/Plan:  Jennifer Andrade is a 73 y.o. White or Caucasian [1] female with  has a past medical history of ANEMIA-IRON DEFICIENCY (04/15/2008), Cataract, CKD (chronic kidney disease) stage 3, GFR 30-59 ml/min (HCC) (03/21/2019), Heart murmur, HYPERLIPIDEMIA (04/15/2008), HYPERTENSION (04/15/2008), HYPOTHYROIDISM (04/15/2008), LUMBAR RADICULOPATHY, LEFT (11/24/2009), Migraine (03/25/2011), OSTEOPENIA (04/15/2008), Other specified forms of hearing  loss (09/30/2010), Phlebitis (1974), SINUSITIS- ACUTE-NOS (09/30/2010), and Vitamin D  deficiency (10/2011).  UTI (urinary tract infection) Likely recurrent, non toxic today, for cipro  course x 10 days, also check UA and cx  CKD (chronic kidney disease) stage 3, GFR 30-59 ml/min (HCC) Lab  Results  Component Value Date   CREATININE 1.22 (H) 07/10/2023   Stable overall, cont to avoid nephrotoxins   Vitamin D  deficiency Last vitamin D  Lab Results  Component Value Date   VD25OH 24.40 (L) 07/10/2023   Low, to start oral replacement   Essential hypertension BP Readings from Last 3 Encounters:  04/24/24 (!) 148/78  01/11/24 122/80  09/27/23 (!) 180/80   Uncontrolled today , pt states controlled at home, likely reactive today,  pt to continue medical treatment norvasc  5 every day, toprol  xl 100 every day, benicar  40 mg qd  Followup: Return if symptoms worsen or fail to improve.  Lynwood Rush, MD 04/24/2024 4:42 PM  [facility]

## 2024-04-24 NOTE — Assessment & Plan Note (Signed)
Lab Results  Component Value Date   CREATININE 1.22 (H) 07/10/2023   Stable overall, cont to avoid nephrotoxins

## 2024-04-24 NOTE — Assessment & Plan Note (Signed)
Last vitamin D Lab Results  Component Value Date   VD25OH 24.40 (L) 07/10/2023   Low, to start oral replacement

## 2024-04-24 NOTE — Assessment & Plan Note (Signed)
 Likely recurrent, non toxic today, for cipro  course x 10 days, also check UA and cx

## 2024-04-24 NOTE — Patient Instructions (Signed)
 Please take all new medication as prescribed - the antibiotic  Please continue all other medications as before, and refills have been done if requested.  Please have the pharmacy call with any other refills you may need.  Please keep your appointments with your specialists as you may have planned  We will follow up on the results of the urine testing in the next 1-2 days.

## 2024-04-27 ENCOUNTER — Ambulatory Visit: Payer: Self-pay | Admitting: Internal Medicine

## 2024-04-27 LAB — URINE CULTURE

## 2024-07-09 ENCOUNTER — Other Ambulatory Visit: Payer: Self-pay | Admitting: Internal Medicine

## 2024-07-10 ENCOUNTER — Other Ambulatory Visit: Payer: Self-pay | Admitting: Internal Medicine

## 2024-07-17 ENCOUNTER — Encounter: Payer: Self-pay | Admitting: *Deleted

## 2024-07-17 NOTE — Progress Notes (Signed)
 STORY CONTI                                          MRN: [id number]   07/17/2024   The [facility] Quality Team Specialist reviewed this patient medical record for the purposes of chart review for care gap closure. The following were reviewed: chart review for care gap closure-breast cancer screening and controlling blood pressure.    [facility]BC[facility] Quality Team

## 2024-07-21 ENCOUNTER — Encounter: Payer: Self-pay | Admitting: Internal Medicine

## 2024-07-22 ENCOUNTER — Ambulatory Visit: Admitting: Internal Medicine

## 2024-07-24 ENCOUNTER — Other Ambulatory Visit: Payer: Self-pay | Admitting: Internal Medicine

## 2024-07-24 ENCOUNTER — Other Ambulatory Visit: Payer: Self-pay

## 2024-08-08 NOTE — Progress Notes (Signed)
 Jennifer Andrade                                          MRN: [id number]   08/08/2024   The [facility]BC[facility] Quality Team Specialist reviewed this patient medical record for the purposes of chart review for care gap closure. The following were reviewed: chart review for care gap closure-controlling blood pressure.    [facility]BC[facility] Quality Team

## 2024-08-10 ENCOUNTER — Other Ambulatory Visit: Payer: Self-pay | Admitting: Internal Medicine

## 2024-08-28 ENCOUNTER — Other Ambulatory Visit: Payer: Self-pay

## 2024-08-28 ENCOUNTER — Other Ambulatory Visit: Payer: Self-pay | Admitting: Internal Medicine

## 2024-10-05 ENCOUNTER — Other Ambulatory Visit (HOSPITAL_BASED_OUTPATIENT_CLINIC_OR_DEPARTMENT_OTHER): Payer: Self-pay | Admitting: Nurse Practitioner

## 2024-10-18 NOTE — Progress Notes (Signed)
 Jennifer Andrade                                          MRN: [id number]   10/18/2024   The [facility]BCI Quality Team Specialist reviewed this patient medical record for the purposes of chart review for care gap closure. The following were reviewed: chart review for care gap closure-controlling blood pressure.    [facility]BCI Quality Team

## 2024-10-28 ENCOUNTER — Ambulatory Visit (INDEPENDENT_AMBULATORY_CARE_PROVIDER_SITE_OTHER): Admitting: Internal Medicine

## 2024-10-28 ENCOUNTER — Other Ambulatory Visit

## 2024-10-28 ENCOUNTER — Encounter: Payer: Self-pay | Admitting: Internal Medicine

## 2024-10-28 VITALS — BP 122/78 | HR 65 | Temp 97.9°F | Ht 66.0 in | Wt 104.0 lb

## 2024-10-28 DIAGNOSIS — E538 Deficiency of other specified B group vitamins: Secondary | ICD-10-CM

## 2024-10-28 DIAGNOSIS — N1831 Chronic kidney disease, stage 3a: Secondary | ICD-10-CM

## 2024-10-28 DIAGNOSIS — E7800 Pure hypercholesterolemia, unspecified: Secondary | ICD-10-CM

## 2024-10-28 DIAGNOSIS — Z0001 Encounter for general adult medical examination with abnormal findings: Secondary | ICD-10-CM

## 2024-10-28 DIAGNOSIS — R739 Hyperglycemia, unspecified: Secondary | ICD-10-CM

## 2024-10-28 DIAGNOSIS — R269 Unspecified abnormalities of gait and mobility: Secondary | ICD-10-CM

## 2024-10-28 DIAGNOSIS — Z0000 Encounter for general adult medical examination without abnormal findings: Secondary | ICD-10-CM

## 2024-10-28 DIAGNOSIS — E559 Vitamin D deficiency, unspecified: Secondary | ICD-10-CM | POA: Diagnosis not present

## 2024-10-28 DIAGNOSIS — E039 Hypothyroidism, unspecified: Secondary | ICD-10-CM | POA: Diagnosis not present

## 2024-10-28 LAB — HEPATIC FUNCTION PANEL
ALT: 12 U/L (ref 3–35)
AST: 18 U/L (ref 5–37)
Albumin: 4.5 g/dL (ref 3.5–5.2)
Alkaline Phosphatase: 43 U/L (ref 39–117)
Bilirubin, Direct: 0.1 mg/dL (ref 0.1–0.3)
Total Bilirubin: 0.4 mg/dL (ref 0.2–1.2)
Total Protein: 7.6 g/dL (ref 6.0–8.3)

## 2024-10-28 LAB — BASIC METABOLIC PANEL WITH GFR
BUN: 27 mg/dL — ABNORMAL HIGH (ref 6–23)
CO2: 31 meq/L (ref 19–32)
Calcium: 9.6 mg/dL (ref 8.4–10.5)
Chloride: 102 meq/L (ref 96–112)
Creatinine, Ser: 0.99 mg/dL (ref 0.40–1.20)
GFR: 57.19 mL/min — ABNORMAL LOW
Glucose, Bld: 89 mg/dL (ref 70–99)
Potassium: 4.2 meq/L (ref 3.5–5.1)
Sodium: 140 meq/L (ref 135–145)

## 2024-10-28 LAB — CBC WITH DIFFERENTIAL/PLATELET
Basophils Absolute: 0 K/uL (ref 0.0–0.1)
Basophils Relative: 0.8 % (ref 0.0–3.0)
Eosinophils Absolute: 0.3 K/uL (ref 0.0–0.7)
Eosinophils Relative: 4.5 % (ref 0.0–5.0)
HCT: 41.7 % (ref 36.0–46.0)
Hemoglobin: 14.1 g/dL (ref 12.0–15.0)
Lymphocytes Relative: 24.4 % (ref 12.0–46.0)
Lymphs Abs: 1.4 K/uL (ref 0.7–4.0)
MCHC: 33.7 g/dL (ref 30.0–36.0)
MCV: 94.2 fl (ref 78.0–100.0)
Monocytes Absolute: 0.8 K/uL (ref 0.1–1.0)
Monocytes Relative: 13.8 % — ABNORMAL HIGH (ref 3.0–12.0)
Neutro Abs: 3.2 K/uL (ref 1.4–7.7)
Neutrophils Relative %: 56.5 % (ref 43.0–77.0)
Platelets: 198 K/uL (ref 150.0–400.0)
RBC: 4.43 Mil/uL (ref 3.87–5.11)
RDW: 14 % (ref 11.5–15.5)
WBC: 5.6 K/uL (ref 4.0–10.5)

## 2024-10-28 LAB — LIPID PANEL
Cholesterol: 148 mg/dL (ref 28–200)
HDL: 63.1 mg/dL
LDL Cholesterol: 67 mg/dL (ref 10–99)
NonHDL: 85.39
Total CHOL/HDL Ratio: 2
Triglycerides: 91 mg/dL (ref 10.0–149.0)
VLDL: 18.2 mg/dL (ref 0.0–40.0)

## 2024-10-28 LAB — HEMOGLOBIN A1C: Hgb A1c MFr Bld: 6 % (ref 4.6–6.5)

## 2024-10-28 LAB — VITAMIN D 25 HYDROXY (VIT D DEFICIENCY, FRACTURES): VITD: 17.29 ng/mL — ABNORMAL LOW (ref 30.00–100.00)

## 2024-10-28 LAB — TSH: TSH: 6.75 u[IU]/mL — ABNORMAL HIGH (ref 0.35–5.50)

## 2024-10-28 LAB — VITAMIN B12: Vitamin B-12: 340 pg/mL (ref 211–911)

## 2024-10-28 MED ORDER — OLMESARTAN MEDOXOMIL 40 MG PO TABS: 40.0000 mg | ORAL_TABLET | Freq: Every day | ORAL | 3 refills | Status: AC

## 2024-10-28 MED ORDER — REPATHA SURECLICK 140 MG/ML ~~LOC~~ SOAJ: 1.0000 mL | SUBCUTANEOUS | 0 refills | Status: AC

## 2024-10-28 MED ORDER — EZETIMIBE 10 MG PO TABS: 10.0000 mg | ORAL_TABLET | Freq: Every day | ORAL | 3 refills | Status: AC

## 2024-10-28 MED ORDER — AMLODIPINE BESYLATE 5 MG PO TABS: 5.0000 mg | ORAL_TABLET | Freq: Every day | ORAL | 2 refills | Status: AC

## 2024-10-28 MED ORDER — METOPROLOL SUCCINATE ER 100 MG PO TB24: 50.0000 mg | ORAL_TABLET | Freq: Every day | ORAL | 3 refills | Status: AC

## 2024-10-28 MED ORDER — LEVOTHYROXINE SODIUM 75 MCG PO TABS: 75.0000 ug | ORAL_TABLET | Freq: Every day | ORAL | 3 refills | Status: AC

## 2024-10-28 NOTE — Progress Notes (Signed)
 Patient ID: Jennifer Andrade, female   DOB: 1951-01-09, 74 y.o.   MRN: [id number]         Chief Complaint:: wellness exam and Medical Management of Chronic Issues (Med check , having some issues with her right foot )  And cannot walk the parking lots, low thyroid        HPI:  Jennifer Andrade is a 74 y.o. female here for wellness exam; declines all vaccinations, colonoscopy , dxa and mammogram o/w up to date                        Also has chronic right foot and ankle pain, has seen ortho but chronic x 2 yrs, has difficulty now with walking the parking lots for shopping due to pain and risk of fall.  Pt denies chest pain, increased sob or doe, wheezing, orthopnea, PND, increased LE swelling, palpitations, dizziness or syncope.   Pt denies polydipsia, polyuria, or new focal neuro s/s.    Wt Readings from Last 3 Encounters:  10/28/24 104 lb (47.2 kg)  04/24/24 104 lb (47.2 kg)  01/11/24 106 lb (48.1 kg)   BP Readings from Last 3 Encounters:  10/28/24 122/78  04/24/24 (!) 148/78  01/11/24 122/80   Immunization History  Administered Date(s) Administered   PFIZER(Purple Top)SARS-COV-2 Vaccination 01/06/2020, 01/27/2020   Health Maintenance Due  Topic Date Due   DTaP/Tdap/Td (1 - Tdap) Never done   Pneumococcal Vaccine: 50+ Years (1 of 2 - PCV) Never done   Zoster Vaccines- Shingrix (1 of 2) Never done   Colonoscopy  Never done   Lung Cancer Screening  Never done   Bone Density Scan  Never done   Medicare Annual Wellness (AWV)  09/28/2021   Mammogram  11/12/2023   Influenza Vaccine  Never done      Past Medical History:  Diagnosis Date   ANEMIA-IRON DEFICIENCY 04/15/2008   Cataract    CKD (chronic kidney disease) stage 3, GFR 30-59 ml/min (HCC) 03/21/2019   Heart murmur    as a child   HYPERLIPIDEMIA 04/15/2008   HYPERTENSION 04/15/2008   HYPOTHYROIDISM 04/15/2008   LUMBAR RADICULOPATHY, LEFT 11/24/2009   Migraine 03/25/2011   OSTEOPENIA 04/15/2008   Other specified forms of hearing  loss 09/30/2010   Phlebitis 1974   when pregnant   SINUSITIS- ACUTE-NOS 09/30/2010   Vitamin D  deficiency 10/2011   level 16   Past Surgical History:  Procedure Laterality Date   BACK SURGERY     cataract surg     lens replacement Bil   tooth removal  2002   TUBAL LIGATION      reports that she quit smoking about 8 years ago. Her smoking use included cigarettes. She started smoking about 55 years ago. She has a 47 pack-year smoking history. She has never used smokeless tobacco. She reports current alcohol use of about 1.0 standard drink of alcohol per week. She reports that she does not use drugs. family history includes Colon cancer in her mother; Heart disease in her father; Hypertension in her father; Kidney disease in her father; Thyroid  disease in her mother. Allergies[1] Medications Ordered Prior to Encounter[2]      ROS:  All others reviewed and negative.  Objective        PE:  BP 122/78 (BP Location: Right Arm, Patient Position: Sitting, Cuff Size: Normal)   Pulse 65   Temp 97.9 F (36.6 C) (Oral)   Ht 5' 6 (1.676 m)  Wt 104 lb (47.2 kg)   LMP 10/24/2001   SpO2 94%   BMI 16.79 kg/m                 Constitutional: Pt appears in NAD               HENT: Head: NCAT.                Right Ear: External ear normal.                 Left Ear: External ear normal.                Eyes: . Pupils are equal, round, and reactive to light. Conjunctivae and EOM are normal               Nose: without d/c or deformity               Neck: Neck supple. Gross normal ROM               Cardiovascular: Normal rate and regular rhythm.                 Pulmonary/Chest: Effort normal and breath sounds without rales or wheezing.                Abd:  Soft, NT, ND, + BS, no organomegaly               Neurological: Pt is alert. At baseline orientation, motor grossly intact               Skin: Skin is warm. No rashes, no other new lesions, LE edema - none               Psychiatric: Pt behavior is  normal without agitation   Micro: none  Cardiac tracings I have personally interpreted today:  none  Pertinent Radiological findings (summarize): none   Lab Results  Component Value Date   WBC 5.6 10/28/2024   HGB 14.1 10/28/2024   HCT 41.7 10/28/2024   PLT 198.0 10/28/2024   GLUCOSE 89 10/28/2024   CHOL 148 10/28/2024   TRIG 91.0 10/28/2024   HDL 63.10 10/28/2024   LDLDIRECT 195.2 03/22/2011   LDLCALC 67 10/28/2024   ALT 12 10/28/2024   AST 18 10/28/2024   NA 140 10/28/2024   K 4.2 10/28/2024   CL 102 10/28/2024   CREATININE 0.99 10/28/2024   BUN 27 (H) 10/28/2024   CO2 31 10/28/2024   TSH 6.75 (H) 10/28/2024   HGBA1C 6.0 10/28/2024   Assessment/Plan:  Jennifer Andrade is a 74 y.o. White or Caucasian [1] female with  has a past medical history of ANEMIA-IRON DEFICIENCY (04/15/2008), Cataract, CKD (chronic kidney disease) stage 3, GFR 30-59 ml/min (HCC) (03/21/2019), Heart murmur, HYPERLIPIDEMIA (04/15/2008), HYPERTENSION (04/15/2008), HYPOTHYROIDISM (04/15/2008), LUMBAR RADICULOPATHY, LEFT (11/24/2009), Migraine (03/25/2011), OSTEOPENIA (04/15/2008), Other specified forms of hearing loss (09/30/2010), Phlebitis (1974), SINUSITIS- ACUTE-NOS (09/30/2010), and Vitamin D  deficiency (10/2011).  Encounter for well adult exam with abnormal findings Age and sex appropriate education and counseling updated with regular exercise and diet Referrals for preventative services - declines colonoscopy, mammogram Immunizations addressed - declines all vaccinations Smoking counseling  - none needed Evidence for depression or other mood disorder - chronic anxiety stable Most recent labs reviewed. I have personally reviewed and have noted: 1) the patient's medical and social history 2) The patient's current medications and supplements 3) The patient's height, weight, and BMI have been recorded  in the chart   Vitamin D  deficiency Last vitamin D  Lab Results  Component Value Date   VD25OH 17.29 (L)  10/28/2024   Low, to start oral replacement   Hypothyroidism Lab Results  Component Value Date   TSH 6.75 (H) 10/28/2024   uncontrolled, pt to increase levothyroxine  75 mcg every day, f/u lab    B12 deficiency Lab Results  Component Value Date   VITAMINB12 340 10/28/2024   Stable, cont oral replacement - b12 1000 mcg qd   CKD (chronic kidney disease) stage 3, GFR 30-59 ml/min (HCC) Ckd3a  Lab Results  Component Value Date   CREATININE 0.99 10/28/2024   Stable overall, cont to avoid nephrotoxins   Pure hypercholesterolemia Lab Results  Component Value Date   LDLCALC 67 10/28/2024   Stable, pt to continue current statin repatha  140 mg , zetia  10 mg qd   Gait disorder Ok for handicapped parking application  Followup: Return in about 6 months (around 04/27/2025).  Lynwood Rush, MD 10/28/2024 8:58 PM [place]     [1]  Allergies Allergen Reactions   Atorvastatin     REACTION: myalgias   Rosuvastatin      REACTION: memory problem   Shellfish Protein-Containing Drug Products Other (See Comments)   Sulfa  Antibiotics Other (See Comments)    Pt fearful to take after daughter with steven johnson  [2]  Current Outpatient Medications on File Prior to Visit  Medication Sig Dispense Refill   Cholecalciferol  50 MCG (2000 UT) TABS 1 ta by mouth once daily 30 tablet 99   hydrocortisone  (ANUSOL -HC) 2.5 % rectal cream PLACE 1 APPLICATION RECTALLY 2 (TWO) TIMES DAILY. 30 g 0   No current facility-administered medications on file prior to visit.

## 2024-10-28 NOTE — Assessment & Plan Note (Signed)
 Lab Results  Component Value Date   VITAMINB12 340 10/28/2024   Stable, cont oral replacement - b12 1000 mcg qd

## 2024-10-28 NOTE — Assessment & Plan Note (Signed)
 Lab Results  Component Value Date   LDLCALC 67 10/28/2024   Stable, pt to continue current statin repatha  140 mg , zetia  10 mg qd

## 2024-10-28 NOTE — Patient Instructions (Signed)
 Ok for the handicapped parking application today  Ok to increase the levothyroxine  to 75 mcg per day  Please continue all other medications as before, and refills have been done as requested.  Please have the pharmacy call with any other refills you may need.  Please continue your efforts at being more active, low cholesterol diet, and weight control.  You are otherwise up to date with prevention measures today.  Please keep your appointments with your specialists as you may have planned  Your Lab work was excellent!  Please make an Appointment to return in 6 months, or sooner if needed

## 2024-10-28 NOTE — Assessment & Plan Note (Signed)
 Ckd3a  Lab Results  Component Value Date   CREATININE 0.99 10/28/2024   Stable overall, cont to avoid nephrotoxins

## 2024-10-28 NOTE — Assessment & Plan Note (Signed)
 Last vitamin D  Lab Results  Component Value Date   VD25OH 17.29 (L) 10/28/2024   Low, to start oral replacement

## 2024-10-28 NOTE — Assessment & Plan Note (Signed)
 Ok for handicapped parking application

## 2024-10-28 NOTE — Assessment & Plan Note (Signed)
 Age and sex appropriate education and counseling updated with regular exercise and diet Referrals for preventative services - declines colonoscopy, mammogram Immunizations addressed - declines all vaccinations Smoking counseling  - none needed Evidence for depression or other mood disorder - chronic anxiety stable Most recent labs reviewed. I have personally reviewed and have noted: 1) the patient's medical and social history 2) The patient's current medications and supplements 3) The patient's height, weight, and BMI have been recorded in the chart

## 2024-10-28 NOTE — Assessment & Plan Note (Signed)
 Lab Results  Component Value Date   TSH 6.75 (H) 10/28/2024   uncontrolled, pt to increase levothyroxine  75 mcg every day, f/u lab

## 2024-10-30 ENCOUNTER — Other Ambulatory Visit: Payer: Self-pay | Admitting: Internal Medicine

## 2024-10-31 NOTE — Telephone Encounter (Signed)
 Pt is Overdue to see Dr. Mona. Red X's.

## 2024-10-31 NOTE — Telephone Encounter (Signed)
 Med already filled by Dr Norleen

## 2024-11-08 ENCOUNTER — Other Ambulatory Visit: Payer: Self-pay | Admitting: Internal Medicine

## 2024-11-08 DIAGNOSIS — N1831 Chronic kidney disease, stage 3a: Secondary | ICD-10-CM

## 2024-11-20 NOTE — Progress Notes (Signed)
 Jennifer Andrade                                          MRN: [id number]   11/20/2024   The [facility]BCI Quality Team Specialist reviewed this patient medical record for the purposes of chart review for care gap closure. The following were reviewed: chart review for care gap closure-controlling blood pressure.    VBCI Quality Team

## 2025-03-11 ENCOUNTER — Ambulatory Visit: Admitting: Family Medicine
